# Patient Record
Sex: Female | Born: 1935 | Race: White | Hispanic: No | Smoking: Never smoker
Health system: Southern US, Community
[De-identification: ages and names within clinical notes are randomized; demographics above are authoritative.]

## PROBLEM LIST (undated history)

## (undated) DIAGNOSIS — N952 Postmenopausal atrophic vaginitis: Secondary | ICD-10-CM

## (undated) DIAGNOSIS — F0390 Unspecified dementia without behavioral disturbance: Secondary | ICD-10-CM

## (undated) DIAGNOSIS — G309 Alzheimer's disease, unspecified: Secondary | ICD-10-CM

## (undated) DIAGNOSIS — N390 Urinary tract infection, site not specified: Secondary | ICD-10-CM

## (undated) DIAGNOSIS — F0280 Dementia in other diseases classified elsewhere without behavioral disturbance: Secondary | ICD-10-CM

## (undated) DIAGNOSIS — J45909 Unspecified asthma, uncomplicated: Secondary | ICD-10-CM

## (undated) DIAGNOSIS — I10 Essential (primary) hypertension: Secondary | ICD-10-CM

## (undated) HISTORY — DX: Essential (primary) hypertension: I10

## (undated) HISTORY — DX: Urinary tract infection, site not specified: N39.0

## (undated) HISTORY — PX: OTHER SURGICAL HISTORY: SHX169

## (undated) HISTORY — DX: Unspecified dementia, unspecified severity, without behavioral disturbance, psychotic disturbance, mood disturbance, and anxiety: F03.90

## (undated) HISTORY — DX: Postmenopausal atrophic vaginitis: N95.2

## (undated) HISTORY — DX: Dementia in other diseases classified elsewhere without behavioral disturbance: F02.80

## (undated) HISTORY — DX: Alzheimer's disease, unspecified: G30.9

## (undated) HISTORY — DX: Unspecified asthma, uncomplicated: J45.909

---

## 2004-01-08 ENCOUNTER — Ambulatory Visit: Payer: Self-pay | Admitting: Gastroenterology

## 2005-02-23 ENCOUNTER — Ambulatory Visit: Payer: Self-pay

## 2007-02-27 ENCOUNTER — Ambulatory Visit: Payer: Self-pay | Admitting: Internal Medicine

## 2007-05-08 ENCOUNTER — Ambulatory Visit: Payer: Self-pay | Admitting: Family Medicine

## 2007-07-30 ENCOUNTER — Ambulatory Visit: Payer: Self-pay | Admitting: Internal Medicine

## 2008-03-06 ENCOUNTER — Ambulatory Visit: Payer: Self-pay | Admitting: Internal Medicine

## 2008-07-14 ENCOUNTER — Ambulatory Visit: Payer: Self-pay | Admitting: Internal Medicine

## 2009-03-09 ENCOUNTER — Ambulatory Visit: Payer: Self-pay | Admitting: Internal Medicine

## 2010-03-29 ENCOUNTER — Ambulatory Visit: Payer: Self-pay | Admitting: Internal Medicine

## 2011-07-21 ENCOUNTER — Ambulatory Visit: Payer: Self-pay | Admitting: Internal Medicine

## 2012-08-01 ENCOUNTER — Ambulatory Visit: Payer: Self-pay | Admitting: Physician Assistant

## 2012-09-27 ENCOUNTER — Emergency Department: Payer: Self-pay | Admitting: Emergency Medicine

## 2012-09-27 LAB — CBC
HCT: 36.3 % (ref 35.0–47.0)
HGB: 12.6 g/dL (ref 12.0–16.0)
MCV: 90 fL (ref 80–100)
Platelet: 265 10*3/uL (ref 150–440)
WBC: 9.1 10*3/uL (ref 3.6–11.0)

## 2012-09-27 LAB — COMPREHENSIVE METABOLIC PANEL
Albumin: 3.6 g/dL (ref 3.4–5.0)
Anion Gap: 4 — ABNORMAL LOW (ref 7–16)
BUN: 14 mg/dL (ref 7–18)
Calcium, Total: 8.8 mg/dL (ref 8.5–10.1)
Co2: 27 mmol/L (ref 21–32)
Glucose: 95 mg/dL (ref 65–99)
Potassium: 4.2 mmol/L (ref 3.5–5.1)
SGOT(AST): 21 U/L (ref 15–37)
SGPT (ALT): 21 U/L (ref 12–78)
Total Protein: 6.8 g/dL (ref 6.4–8.2)

## 2012-09-27 LAB — URINALYSIS, COMPLETE
Bilirubin,UR: NEGATIVE
Blood: NEGATIVE
Glucose,UR: NEGATIVE mg/dL (ref 0–75)
Ph: 6 (ref 4.5–8.0)
Protein: NEGATIVE
RBC,UR: 3 /HPF (ref 0–5)
WBC UR: 29 /HPF (ref 0–5)

## 2013-06-27 ENCOUNTER — Ambulatory Visit: Payer: Self-pay | Admitting: Internal Medicine

## 2013-08-29 ENCOUNTER — Ambulatory Visit: Payer: Self-pay | Admitting: Physician Assistant

## 2013-10-29 ENCOUNTER — Ambulatory Visit: Payer: Self-pay | Admitting: Physician Assistant

## 2014-01-18 ENCOUNTER — Emergency Department: Payer: Self-pay | Admitting: Emergency Medicine

## 2014-01-18 LAB — URINALYSIS, COMPLETE
BLOOD: NEGATIVE
Bilirubin,UR: NEGATIVE
Glucose,UR: NEGATIVE mg/dL (ref 0–75)
Ketone: NEGATIVE
NITRITE: POSITIVE
PH: 6 (ref 4.5–8.0)
PROTEIN: NEGATIVE
RBC,UR: <1 /HPF (ref 0–5)
Specific Gravity: 1.012 (ref 1.003–1.030)
Squamous Epithelial: <1

## 2014-01-18 LAB — COMPREHENSIVE METABOLIC PANEL
ALK PHOS: 67 U/L
AST: 15 U/L (ref 15–37)
Albumin: 3.4 g/dL (ref 3.4–5.0)
Anion Gap: 6 — ABNORMAL LOW (ref 7–16)
BUN: 14 mg/dL (ref 7–18)
Bilirubin,Total: 0.3 mg/dL (ref 0.2–1.0)
CO2: 27 mmol/L (ref 21–32)
CREATININE: 0.88 mg/dL (ref 0.60–1.30)
Calcium, Total: 8.7 mg/dL (ref 8.5–10.1)
Chloride: 105 mmol/L (ref 98–107)
EGFR (African American): >60
EGFR (Non-African Amer.): >60
GLUCOSE: 125 mg/dL — AB (ref 65–99)
OSMOLALITY: 278 (ref 275–301)
POTASSIUM: 4 mmol/L (ref 3.5–5.1)
SGPT (ALT): 15 U/L
Sodium: 138 mmol/L (ref 136–145)
Total Protein: 6.3 g/dL — ABNORMAL LOW (ref 6.4–8.2)

## 2014-01-18 LAB — LIPASE, BLOOD: LIPASE: 119 U/L (ref 73–393)

## 2014-01-18 LAB — CBC
HCT: 36.3 % (ref 35.0–47.0)
HGB: 12 g/dL (ref 12.0–16.0)
MCH: 30.8 pg (ref 26.0–34.0)
MCHC: 33.1 g/dL (ref 32.0–36.0)
MCV: 93 fL (ref 80–100)
PLATELETS: 223 10*3/uL (ref 150–440)
RBC: 3.89 10*6/uL (ref 3.80–5.20)
RDW: 13.3 % (ref 11.5–14.5)
WBC: 7.4 10*3/uL (ref 3.6–11.0)

## 2014-01-21 LAB — URINE CULTURE

## 2014-01-28 DIAGNOSIS — I10 Essential (primary) hypertension: Secondary | ICD-10-CM | POA: Diagnosis not present

## 2014-01-28 DIAGNOSIS — Z0001 Encounter for general adult medical examination with abnormal findings: Secondary | ICD-10-CM | POA: Diagnosis not present

## 2014-01-28 DIAGNOSIS — M810 Age-related osteoporosis without current pathological fracture: Secondary | ICD-10-CM | POA: Diagnosis not present

## 2014-01-28 DIAGNOSIS — R011 Cardiac murmur, unspecified: Secondary | ICD-10-CM | POA: Diagnosis not present

## 2014-01-28 DIAGNOSIS — N390 Urinary tract infection, site not specified: Secondary | ICD-10-CM | POA: Diagnosis not present

## 2014-01-28 DIAGNOSIS — N3020 Other chronic cystitis without hematuria: Secondary | ICD-10-CM | POA: Diagnosis not present

## 2014-01-28 DIAGNOSIS — G309 Alzheimer's disease, unspecified: Secondary | ICD-10-CM | POA: Diagnosis not present

## 2014-02-17 DIAGNOSIS — H3532 Exudative age-related macular degeneration: Secondary | ICD-10-CM | POA: Diagnosis not present

## 2014-02-25 DIAGNOSIS — N390 Urinary tract infection, site not specified: Secondary | ICD-10-CM | POA: Diagnosis not present

## 2014-03-11 DIAGNOSIS — N390 Urinary tract infection, site not specified: Secondary | ICD-10-CM | POA: Diagnosis not present

## 2014-03-26 DIAGNOSIS — Z7189 Other specified counseling: Secondary | ICD-10-CM | POA: Diagnosis not present

## 2014-03-26 DIAGNOSIS — G309 Alzheimer's disease, unspecified: Secondary | ICD-10-CM | POA: Diagnosis not present

## 2014-03-26 DIAGNOSIS — I10 Essential (primary) hypertension: Secondary | ICD-10-CM | POA: Diagnosis not present

## 2014-03-26 DIAGNOSIS — N390 Urinary tract infection, site not specified: Secondary | ICD-10-CM | POA: Diagnosis not present

## 2014-04-11 DIAGNOSIS — R300 Dysuria: Secondary | ICD-10-CM | POA: Diagnosis not present

## 2014-04-28 DIAGNOSIS — H3531 Nonexudative age-related macular degeneration: Secondary | ICD-10-CM | POA: Diagnosis not present

## 2014-04-28 DIAGNOSIS — H3532 Exudative age-related macular degeneration: Secondary | ICD-10-CM | POA: Diagnosis not present

## 2014-05-06 DIAGNOSIS — N390 Urinary tract infection, site not specified: Secondary | ICD-10-CM | POA: Diagnosis not present

## 2014-05-06 DIAGNOSIS — F0390 Unspecified dementia without behavioral disturbance: Secondary | ICD-10-CM | POA: Diagnosis not present

## 2014-05-30 DIAGNOSIS — N390 Urinary tract infection, site not specified: Secondary | ICD-10-CM | POA: Diagnosis not present

## 2014-06-20 DIAGNOSIS — N952 Postmenopausal atrophic vaginitis: Secondary | ICD-10-CM | POA: Diagnosis not present

## 2014-06-20 DIAGNOSIS — N390 Urinary tract infection, site not specified: Secondary | ICD-10-CM | POA: Diagnosis not present

## 2014-07-04 ENCOUNTER — Telehealth: Payer: Self-pay | Admitting: Urology

## 2014-07-04 DIAGNOSIS — N952 Postmenopausal atrophic vaginitis: Secondary | ICD-10-CM

## 2014-07-04 NOTE — Telephone Encounter (Signed)
Pt son called about mothers estrogen cream states she only has a couple samples left. They went to [facility] on [address] in [place] to pick up a Rx sent in by [employer]. Upon arrival they were told that there had been no Rx sent in for the mother. The son is trying to contact the office to see if it had in fact been sent in.  Best contact # [phone]

## 2014-07-07 ENCOUNTER — Encounter: Payer: Self-pay | Admitting: Urology

## 2014-07-07 DIAGNOSIS — H3532 Exudative age-related macular degeneration: Secondary | ICD-10-CM | POA: Diagnosis not present

## 2014-07-07 MED ORDER — ESTRADIOL 0.1 MG/GM VA CREA: 1.0000 | TOPICAL_CREAM | VAGINAL | Status: DC

## 2014-07-07 NOTE — Telephone Encounter (Signed)
Completed Rx and sent to [facility] per Michiel Cowboy, PA-C.  LVM on son's contact number [phone]) confirming request w/ call-back info.

## 2014-07-22 ENCOUNTER — Other Ambulatory Visit: Payer: Self-pay

## 2014-07-22 ENCOUNTER — Telehealth: Payer: Self-pay | Admitting: Urology

## 2014-07-22 DIAGNOSIS — N390 Urinary tract infection, site not specified: Secondary | ICD-10-CM

## 2014-07-22 NOTE — Telephone Encounter (Signed)
That would be fine.  Make sure he collects a clean catch specimen for culture.

## 2014-07-22 NOTE — Telephone Encounter (Signed)
Pt son is going to drop of clean catch urine today. Culture orders have been placed in epic. Cw,lpn

## 2014-07-22 NOTE — Telephone Encounter (Signed)
Patient's son called the office this morning.  Patient has been using the estrogen cream.  She is now complaining of painful urination. Patient's son thinks that she may have a UTI.    Patient has sterile urine cups at home.  Can they bring in a urine sample to culture?  Please route to triage nurse to contact patient's son to advise.

## 2014-07-24 ENCOUNTER — Other Ambulatory Visit: Payer: Medicare Other

## 2014-07-24 ENCOUNTER — Telehealth: Payer: Self-pay | Admitting: Urology

## 2014-07-24 NOTE — Progress Notes (Signed)
Pt was brought into office today by son to give a clean catch specimen. Son stated he gave pt cephalexin last night and this morning. Per Carollee HerterShannon pt should finish abt and 3-5 days post therapy give a urine specimen for culture. Nurse gave son specimen cup, hat, and wipes to obtain specimen. Son voiced understanding of situation and needing another specimen. Cw,lpn

## 2014-07-24 NOTE — Telephone Encounter (Signed)
Patient's son called stating that he thinks Mrs. Caitlin Ross may have a UTI she has been complaining of painful urination for the past couple days. They are trying to collect a sample to bring in and wanted to know if she should start some abx due to the holiday weekend. I explained that they could try AZO OTC to help with the painful urination and that we would need to see the sample to determine if we should call in abx prior to getting the culture results but with the long weekend coming up we would most likely try and treat. Patient's son understands and will bring a urine later this afternoon for analysis.

## 2014-08-04 ENCOUNTER — Ambulatory Visit (INDEPENDENT_AMBULATORY_CARE_PROVIDER_SITE_OTHER): Payer: Medicare Other | Admitting: Urology

## 2014-08-04 ENCOUNTER — Other Ambulatory Visit: Payer: Self-pay | Admitting: *Deleted

## 2014-08-04 DIAGNOSIS — R300 Dysuria: Secondary | ICD-10-CM

## 2014-08-04 DIAGNOSIS — N390 Urinary tract infection, site not specified: Secondary | ICD-10-CM

## 2014-08-04 MED ORDER — CIPROFLOXACIN HCL 500 MG PO TABS: 500.0000 mg | ORAL_TABLET | Freq: Two times a day (BID) | ORAL | Status: AC

## 2014-08-04 NOTE — Progress Notes (Signed)
Pt son brought in cc urine post abt. U/a and cx was done. Per Dr. Frederich ChickBrandon cipro was called in until we get cx results. Pt son was made aware. Cw,lpn

## 2014-08-07 LAB — CULTURE, URINE COMPREHENSIVE

## 2014-08-08 ENCOUNTER — Telehealth: Payer: Self-pay

## 2014-08-08 NOTE — Telephone Encounter (Signed)
Spoke with pt son in reference to medication for pt infection. Son stated that pt can only take liquid medication due to dementia.  Nurse spoke with pt pharmacy who stated cipro does not come in liquid but medication can be crushed and put in applesauce.  Nurse returned pt son call. Made him aware medication is not offered in liquid form but it can be crushed. Pt son voiced understanding. Nurse made son aware if pt will not take medication this route to call back Monday and a medication that does come liquid will be called in. Cw,lpn

## 2014-08-10 ENCOUNTER — Telehealth: Payer: Self-pay | Admitting: Urology

## 2014-08-10 DIAGNOSIS — N390 Urinary tract infection, site not specified: Secondary | ICD-10-CM

## 2014-08-11 ENCOUNTER — Telehealth: Payer: Self-pay

## 2014-08-11 NOTE — Telephone Encounter (Signed)
I'm not sure if Cipro comes in liquid form. I know that Levaquin does come in a liquid form. We will have to contact her pharmacy and see what they recommend.

## 2014-08-11 NOTE — Telephone Encounter (Signed)
Spoke with pt son who stated pt has only taken about 3 of the 6 cipro pills rx. [facility], pt son, was not able to crush pill and put in food for pt to take. Son was wanting to know if a liquid abt could be called in. Pt is stil c/o burning on urination, having urgency, and not being able to make it to the bathroom. Please advise.

## 2014-08-12 NOTE — Telephone Encounter (Signed)
[facility] and they would have to order any liquid abt. They dont have anything on hand. Please advise.

## 2014-08-13 MED ORDER — LEVOFLOXACIN 25 MG/ML PO SOLN: 250.0000 mg | Freq: Every day | ORAL | Status: AC

## 2014-08-13 NOTE — Telephone Encounter (Signed)
I have prescribed Levaquin in liquid form for the patient.

## 2014-08-14 NOTE — Telephone Encounter (Signed)
Spoke with pt son and made aware of liquid medication at pharmacy. Son voiced understanding.

## 2014-08-15 NOTE — Telephone Encounter (Signed)
When nurse spoke with pt son yesterday, son stated he would pick medication up today. Liquid medication had to be ordered.

## 2014-08-15 NOTE — Telephone Encounter (Signed)
Has this patient started her liquid antibiotic?

## 2014-08-27 NOTE — Telephone Encounter (Signed)
Patient should be finished with her antibiotic by now and she will need a follow up cath specimen.

## 2014-08-27 NOTE — Telephone Encounter (Signed)
LMOM

## 2014-08-27 NOTE — Telephone Encounter (Signed)
Spoke with pt son in reference to abt. Son stated pt has completed abt. Pt will come in Friday for cath specimen.

## 2014-08-29 ENCOUNTER — Ambulatory Visit (INDEPENDENT_AMBULATORY_CARE_PROVIDER_SITE_OTHER): Payer: Medicare Other

## 2014-08-29 DIAGNOSIS — N390 Urinary tract infection, site not specified: Secondary | ICD-10-CM | POA: Diagnosis not present

## 2014-08-29 LAB — URINALYSIS, COMPLETE
BILIRUBIN UA: NEGATIVE
Glucose, UA: NEGATIVE
KETONES UA: NEGATIVE
Nitrite, UA: NEGATIVE
Specific Gravity, UA: 1.02 (ref 1.005–1.030)
Urobilinogen, Ur: 0.2 mg/dL (ref 0.2–1.0)
pH, UA: 6.5 (ref 5.0–7.5)

## 2014-08-29 LAB — MICROSCOPIC EXAMINATION
Epithelial Cells (non renal): NONE SEEN /hpf (ref 0–10)
RBC MICROSCOPIC, UA: NONE SEEN /HPF (ref 0–?)
RENAL EPITHEL UA: NONE SEEN /HPF
WBC, UA: >30 /hpf — AB (ref 0–?)

## 2014-08-29 NOTE — Progress Notes (Signed)
In and Out Catheterization  Patient is present today for a I & O catheterization due to recurrent UTI. Patient was cleaned and prepped in a sterile fashion with betadine and Lidocaine 2% jelly was instilled into the urethra.  A 14FR cath was inserted no complications were noted , 20ml of urine return was noted, urine was cloudy yellow in color. A clean urine sample was collected for urine culture. Bladder was drained  And catheter was removed with out difficulty.    Preformed by: Eligha Bridegroom, CMA  Follow up/ Additional notes: will call with culture results on monday

## 2014-08-31 LAB — CULTURE, URINE COMPREHENSIVE

## 2014-09-01 ENCOUNTER — Telehealth: Payer: Self-pay | Admitting: Urology

## 2014-09-01 MED ORDER — SULFAMETHOXAZOLE-TRIMETHOPRIM 800-160 MG PO TABS: 1.0000 | ORAL_TABLET | Freq: Two times a day (BID) | ORAL | Status: DC

## 2014-09-01 NOTE — Telephone Encounter (Signed)
+  UCx E. Coli, resistant to most PO abx.  Please treat with Bactrim DS bid x 7 days, order placed.    Vanna Scotland, MD

## 2014-09-01 NOTE — Telephone Encounter (Signed)
LMOM in reference to bactrim ds being in a liquid form. Asked pt son to return call to the office.

## 2014-09-15 ENCOUNTER — Ambulatory Visit (INDEPENDENT_AMBULATORY_CARE_PROVIDER_SITE_OTHER): Payer: Medicare Other

## 2014-09-15 DIAGNOSIS — N390 Urinary tract infection, site not specified: Secondary | ICD-10-CM | POA: Diagnosis not present

## 2014-09-15 NOTE — Addendum Note (Signed)
Addended by: Skeet Latch on: 09/15/2014 04:38 PM   Modules accepted: Orders

## 2014-09-15 NOTE — Progress Notes (Signed)
In and Out Catheterization  Patient is present today for a I & O catheterization due to recurrent UTIs. Patient was cleaned and prepped in a sterile fashion with betadine and Lidocaine 2% jelly was instilled into the urethra.  A 14FR cath was inserted no complications were noted , of urine return was noted, urine was clear and yellow in color. A clean urine sample was collected for ucx. Bladder was drained  And catheter was removed with out difficulty.    Preformed by: Rupert Stacks, LPN

## 2014-09-17 ENCOUNTER — Telehealth: Payer: Self-pay

## 2014-09-17 LAB — CULTURE, URINE COMPREHENSIVE

## 2014-09-17 NOTE — Telephone Encounter (Signed)
-----   Message from Shannon A McGowan, PA-C sent at 09/17/2014 10:12 AM EDT ----- Patient is sure and culture is negative for infection.  Patient is to continue using the vaginal estrogen cream 3 nights weekly. If she develops symptoms of urinary tract infection, she should present to get to our office for a catheterized specimen for urinalysis and culture. 

## 2014-09-17 NOTE — Telephone Encounter (Signed)
LMOM

## 2014-09-18 NOTE — Telephone Encounter (Signed)
Spoke with pt son in reference to ucx and vaginal cream. Son voiced understanding.

## 2014-09-18 NOTE — Telephone Encounter (Signed)
-----   Message from Harle Battiest, PA-C sent at 09/17/2014 10:12 AM EDT ----- Patient is sure and culture is negative for infection.  Patient is to continue using the vaginal estrogen cream 3 nights weekly. If she develops symptoms of urinary tract infection, she should present to get to our office for a catheterized specimen for urinalysis and culture.

## 2014-10-06 DIAGNOSIS — H3531 Nonexudative age-related macular degeneration: Secondary | ICD-10-CM | POA: Diagnosis not present

## 2014-10-06 DIAGNOSIS — H3532 Exudative age-related macular degeneration: Secondary | ICD-10-CM | POA: Diagnosis not present

## 2014-10-22 DIAGNOSIS — M1711 Unilateral primary osteoarthritis, right knee: Secondary | ICD-10-CM | POA: Diagnosis not present

## 2014-10-22 DIAGNOSIS — M25512 Pain in left shoulder: Secondary | ICD-10-CM | POA: Diagnosis not present

## 2014-10-22 DIAGNOSIS — M1710 Unilateral primary osteoarthritis, unspecified knee: Secondary | ICD-10-CM | POA: Insufficient documentation

## 2014-10-22 DIAGNOSIS — M19012 Primary osteoarthritis, left shoulder: Secondary | ICD-10-CM | POA: Diagnosis not present

## 2014-10-22 DIAGNOSIS — B029 Zoster without complications: Secondary | ICD-10-CM | POA: Insufficient documentation

## 2014-10-22 DIAGNOSIS — M179 Osteoarthritis of knee, unspecified: Secondary | ICD-10-CM | POA: Insufficient documentation

## 2014-10-22 DIAGNOSIS — M154 Erosive (osteo)arthritis: Secondary | ICD-10-CM | POA: Diagnosis not present

## 2014-10-22 DIAGNOSIS — M19019 Primary osteoarthritis, unspecified shoulder: Secondary | ICD-10-CM | POA: Insufficient documentation

## 2014-10-22 DIAGNOSIS — G8929 Other chronic pain: Secondary | ICD-10-CM | POA: Diagnosis not present

## 2014-10-30 ENCOUNTER — Ambulatory Visit (INDEPENDENT_AMBULATORY_CARE_PROVIDER_SITE_OTHER): Payer: Medicare Other

## 2014-10-30 DIAGNOSIS — N390 Urinary tract infection, site not specified: Secondary | ICD-10-CM

## 2014-10-30 LAB — URINALYSIS, COMPLETE
Bilirubin, UA: NEGATIVE
Glucose, UA: NEGATIVE
NITRITE UA: POSITIVE — AB
Specific Gravity, UA: 1.02 (ref 1.005–1.030)
Urobilinogen, Ur: 0.2 mg/dL (ref 0.2–1.0)
pH, UA: 5.5 (ref 5.0–7.5)

## 2014-10-30 LAB — MICROSCOPIC EXAMINATION
EPITHELIAL CELLS (NON RENAL): NONE SEEN /HPF (ref 0–10)
Renal Epithel, UA: NONE SEEN /hpf
WBC, UA: >30 /hpf — AB (ref 0–?)

## 2014-10-30 MED ORDER — LEVOFLOXACIN 25 MG/ML PO SOLN: 500.0000 mg | Freq: Every day | ORAL | Status: DC

## 2014-10-30 NOTE — Progress Notes (Signed)
In and Out Catheterization  Patient is present today for a I & O catheterization due to recurrent UTI. Patient was cleaned and prepped in a sterile fashion with betadine and Lidocaine 2% jelly was instilled into the urethra.  A 14FR cath was inserted no complications were noted , of urine return was noted, urine was cloudy and yellow in color. A clean urine sample was collected for u/a and cx. Bladder was drained  And catheter was removed with out difficulty.    Preformed by: Rupert Stacks, LPN   Follow up/ Additional notes: Son requested an abx to be given today. Reinforced with son, Deniece Portela, an abx will not be given unless urine is nitrite positive and we also need to get ucx results. Wayne voiced understanding.

## 2014-10-30 NOTE — Addendum Note (Signed)
Addended by: Rupert Stacks C on: 10/30/2014 04:41 PM   Modules accepted: Orders

## 2014-11-01 LAB — CULTURE, URINE COMPREHENSIVE

## 2014-11-04 ENCOUNTER — Telehealth: Payer: Self-pay

## 2014-11-04 NOTE — Telephone Encounter (Signed)
Spoke with son and made aware to continue current abx.

## 2014-11-04 NOTE — Telephone Encounter (Signed)
Yes it is susceptible to Levaquin. You can send in the same prescription that she received last time. Thanks

## 2014-11-04 NOTE — Telephone Encounter (Signed)
Pt can not take bactrim due to it does not come in liquid form. Pt was given liquid levaquin the day of cath specimen. Will that abx be sufficient?

## 2014-11-04 NOTE — Telephone Encounter (Signed)
-----   Message from Fernanda Drum, FNP sent at 11/04/2014  8:26 AM EDT ----- Please notify patient and son that her urine culture was positive for infection. Please send in Bactrim 800 twice a day 7 days to patient's pharmacy. She can return in a few weeks for repeat catheter urine specimen to confirm resolution of urinary tract infection

## 2014-11-14 ENCOUNTER — Telehealth: Payer: Self-pay | Admitting: Radiology

## 2014-11-14 NOTE — Telephone Encounter (Signed)
It is not recommended to take a maintenance antibiotic due to the increased incidence of resistance and episodes of C. Difficile. I recommended appointment with one of the physicians to discuss this further.

## 2014-11-14 NOTE — Telephone Encounter (Signed)
Pt's son, Sharlot GowdaWayne Foote, called asking if pt can have a maintenance antibiotic to prevent her recurrent UTIs. Please return his call at [phone].

## 2014-11-14 NOTE — Telephone Encounter (Signed)
LMOM

## 2014-11-17 NOTE — Telephone Encounter (Signed)
LMOM

## 2014-11-20 NOTE — Telephone Encounter (Signed)
Pt's son, [name], returning Chelsea's call.  Please call after 4:00 p.m. today at [phone].

## 2014-11-21 NOTE — Telephone Encounter (Signed)
Spoke with pt son, [name], in reference to prophylactic abx for pt. Son stated that he wanted to speak with his sister before making appt. Nurse reinforced with [name] to call back for an appt.

## 2014-11-21 NOTE — Telephone Encounter (Signed)
Pt's son, Sharlot GowdaWayne Fortune returned your call concerning his mother.  Please give him a call [phone]

## 2014-11-21 NOTE — Telephone Encounter (Signed)
LMOM

## 2014-12-04 ENCOUNTER — Emergency Department: Payer: Medicare Other

## 2014-12-04 ENCOUNTER — Telehealth: Payer: Self-pay

## 2014-12-04 ENCOUNTER — Emergency Department
Admission: EM | Admit: 2014-12-04 | Discharge: 2014-12-04 | Disposition: A | Discharge status: Home / Self Care | Payer: Medicare Other | Attending: Emergency Medicine | Admitting: Emergency Medicine

## 2014-12-04 DIAGNOSIS — R079 Chest pain, unspecified: Secondary | ICD-10-CM | POA: Diagnosis not present

## 2014-12-04 DIAGNOSIS — G309 Alzheimer's disease, unspecified: Secondary | ICD-10-CM | POA: Diagnosis not present

## 2014-12-04 DIAGNOSIS — R531 Weakness: Secondary | ICD-10-CM | POA: Diagnosis not present

## 2014-12-04 DIAGNOSIS — F0280 Dementia in other diseases classified elsewhere without behavioral disturbance: Secondary | ICD-10-CM | POA: Diagnosis not present

## 2014-12-04 DIAGNOSIS — I10 Essential (primary) hypertension: Secondary | ICD-10-CM | POA: Insufficient documentation

## 2014-12-04 DIAGNOSIS — R0789 Other chest pain: Secondary | ICD-10-CM | POA: Insufficient documentation

## 2014-12-04 DIAGNOSIS — R112 Nausea with vomiting, unspecified: Secondary | ICD-10-CM | POA: Insufficient documentation

## 2014-12-04 DIAGNOSIS — N390 Urinary tract infection, site not specified: Secondary | ICD-10-CM | POA: Insufficient documentation

## 2014-12-04 LAB — CBC
HCT: 37.5 % (ref 35.0–47.0)
Hemoglobin: 12.5 g/dL (ref 12.0–16.0)
MCH: 30.3 pg (ref 26.0–34.0)
MCHC: 33.2 g/dL (ref 32.0–36.0)
MCV: 91.1 fL (ref 80.0–100.0)
PLATELETS: 268 10*3/uL (ref 150–440)
RBC: 4.12 MIL/uL (ref 3.80–5.20)
RDW: 13 % (ref 11.5–14.5)
WBC: 5.6 10*3/uL (ref 3.6–11.0)

## 2014-12-04 LAB — URINALYSIS COMPLETE WITH MICROSCOPIC (ARMC ONLY)
BILIRUBIN URINE: NEGATIVE
Bacteria, UA: NONE SEEN
GLUCOSE, UA: NEGATIVE mg/dL
Hgb urine dipstick: NEGATIVE
KETONES UR: NEGATIVE mg/dL
NITRITE: NEGATIVE
PROTEIN: 30 mg/dL — AB
SPECIFIC GRAVITY, URINE: 1.014 (ref 1.005–1.030)
pH: 7 (ref 5.0–8.0)

## 2014-12-04 LAB — BASIC METABOLIC PANEL
Anion gap: 7 (ref 5–15)
BUN: 13 mg/dL (ref 6–20)
CALCIUM: 9.1 mg/dL (ref 8.9–10.3)
CO2: 28 mmol/L (ref 22–32)
CREATININE: 0.76 mg/dL (ref 0.44–1.00)
Chloride: 104 mmol/L (ref 101–111)
GFR calc Af Amer: >60 mL/min (ref >60)
GLUCOSE: 114 mg/dL — AB (ref 65–99)
POTASSIUM: 4.3 mmol/L (ref 3.5–5.1)
SODIUM: 139 mmol/L (ref 135–145)

## 2014-12-04 LAB — TROPONIN I

## 2014-12-04 MED ORDER — ONDANSETRON 4 MG PO TBDP: 4.0000 mg | ORAL_TABLET | Freq: Three times a day (TID) | ORAL | Status: AC | PRN

## 2014-12-04 MED ORDER — LEVOFLOXACIN 25 MG/ML PO SOLN: 500.0000 mg | Freq: Every day | ORAL | Status: AC

## 2014-12-04 NOTE — ED Provider Notes (Signed)
Time Seen: Approximately 1159 I have reviewed the triage notes  Chief Complaint: Weakness and Chest Pain   History of Present Illness: Caitlin Ross is a 81 y.o. female who presents to the emergency department with generalized weakness and some brief chest discomfort after an episode of vomiting. Patient vomited 1 at home and also one time here in triage. She recently had coffee and the emesis appears to be coffee-like. No obvious hematemesis. Patient denies any chest or abdominal pain at this time. Patient has a history of dementia and is able to answer some questions and is awake and alert however her son who cares for her home is generally the primary historian. He states that she has a history of frequent urinary tract infections and sometimes will present with nausea and vomiting. He denies any focal weakness at home. She seems to be at her baseline mental status. He is not aware of any fever or bowel related issues. No trouble with speech or swallowing.   Past Medical History  Diagnosis Date  . Asthma   . Urinary tract infection   . Dementia   . Hypertension   . Alzheimer's disease   . Atrophic vaginitis     There are no active problems to display for this patient.   Past Surgical History  Procedure Laterality Date  . No surgical hx      Past Surgical History  Procedure Laterality Date  . No surgical hx      Current Outpatient Rx  Name  Route  Sig  Dispense  Refill  . levofloxacin (LEVAQUIN) 25 MG/ML solution   Oral   Take 20 mLs (500 mg total) by mouth daily.   140 mL   0   . ondansetron (ZOFRAN ODT) 4 MG disintegrating tablet   Oral   Take 1 tablet (4 mg total) by mouth every 8 (eight) hours as needed for nausea or vomiting.   20 tablet   0     Allergies:  Review of patient's allergies indicates no known allergies.  Family History: Family History  Problem Relation Age of Onset  . Lung cancer Father   . Stroke Mother   . Hyperlipidemia Sister      Social History: Social History  Substance Use Topics  . Smoking status: None  . Smokeless tobacco: None  . Alcohol Use: None     Review of Systems:   10 point review of systems was performed and was otherwise negative:  Constitutional: No fever Eyes: No visual disturbances ENT: No sore throat, ear pain Cardiac: No chest pain Respiratory: No shortness of breath, wheezing, or stridor Abdomen: No abdominal pain, no vomiting, No diarrhea Endocrine: No weight loss, No night sweats Extremities: No peripheral edema, cyanosis Skin: No rashes, easy bruising Neurologic: No focal weakness, trouble with speech or swollowing Urologic: No dysuria, Hematuria, or urinary frequency  Review of systems was obtained through the patient's son Physical Exam:  ED Triage Vitals  Enc Vitals Group     BP 12/04/14 1110 155/71 mmHg     Pulse Rate 12/04/14 1110 73     Resp 12/04/14 1110 16     Temp 12/04/14 1110 98.1 F (36.7 C)     Temp Source 12/04/14 1110 Oral     SpO2 12/04/14 1110 96 %     Weight 12/04/14 1110 130 lb (58.968 kg)     Height 12/04/14 1110  (1.676 m)     Head Cir --  Peak Flow --      Pain Score --      Pain Loc --      Pain Edu? --      Excl. in GC? --     General: Awake , Alert , and Oriented times 1; GCS 15 Head: Normal cephalic , atraumatic Eyes: Pupils equal , round, reactive to light Nose/Throat: No nasal drainage, patent upper airway without erythema or exudate.  Neck: Supple, Full range of motion, No anterior adenopathy or palpable thyroid masses Lungs: Clear to ascultation without wheezes , rhonchi, or rales Heart: Regular rate, regular rhythm without murmurs , gallops , or rubs Abdomen: Soft, non tender without rebound, guarding , or rigidity; bowel sounds positive and symmetric in all 4 quadrants. No organomegaly .        Extremities: 2 plus symmetric pulses. No edema, clubbing or cyanosis Neurologic: normal ambulation, Motor symmetric without  deficits, sensory intact Skin: warm, dry, no rashes   Labs:   All laboratory work was reviewed including any pertinent negatives or positives listed below:  Labs Reviewed  BASIC METABOLIC PANEL - Abnormal; Notable for the following:    Glucose, Bld 114 (*)    All other components within normal limits  URINALYSIS COMPLETEWITH MICROSCOPIC ([facility] ONLY) - Abnormal; Notable for the following:    Color, Urine YELLOW (*)    APPearance CLOUDY (*)    Protein, ur 30 (*)    Leukocytes, UA 2+ (*)    Squamous Epithelial / LPF 0-5 (*)    All other components within normal limits  URINE CULTURE  CBC  TROPONIN I   review of laboratory work shows findings consistent with a urinary tract infection. Urine culture is pending.  EKG: ED ECG REPORT I, Jennye Moccasin, the attending physician, personally viewed and interpreted this ECG.  Date: 12/04/2014 EKG Time: 1118 Rate: 85 Rhythm: normal sinus rhythm QRS Axis: Left axis deviation Intervals: normal ST/T Wave abnormalities: normal Conduction Disutrbances: none Narrative Interpretation: unremarkable No acute ischemic changes noted   Radiology:     Dementia.  EXAM: CHEST - 2 VIEW  COMPARISON: Two-view chest x-ray 08/29/2013.  FINDINGS: The heart size is exaggerated by low lung volumes. Atherosclerotic calcifications are noted in the aortic arch. Emphysematous changes are again noted.  Emphysematous changes are again noted. No focal airspace disease is present. Remote thoracic compression fractures and exaggerated kyphosis are stable. Moderate degenerative changes are again seen in the shoulders.  IMPRESSION: 1. No acute cardiopulmonary disease. 2. Atherosclerosis. 3. Emphysema.  I personally reviewed the radiologic studies      ED Course: Patient seemed to have some episode of bradycardia in the triage area associated with the vomiting spell which may have been one nature. She was placed on a continuous cardiac  monitor here and remained in normal sinus rhythm and no ectopy. Her EKG shows no ischemic changes and her troponin is negative and I felt this was unlikely to be an acute coronary syndrome. Site appears to be in agreement and was noted to have a urinary tract infection. He states she responds well to oral Levaquin the requested it in a liquid form because it's easier for the patient to take. She'll be discharged on the antibiotic along with anti-medic therapy. Her episode of chest discomfort seemed to be brief and also related to the vomiting and may have been some form of gastrointestinal presentation.    Assessment: Acute nausea and vomiting Generalized weakness Urinary tract infection   Final Clinical  Impression:  Final diagnoses:  Acute urinary tract infection     Plan:  Outpatient management Patient was advised to return immediately if condition worsens. Patient was advised to follow up with her primary care physician or other specialized physicians involved and in their current assessment.            Jennye MoccasinBrian S Lequisha Cammack, MD 12/04/14 [phone]

## 2014-12-04 NOTE — ED Notes (Signed)
Pt brought in by son for evaluation of weakness and CP. Pt was unsteady on her feet this AM, vomiting X1 at home and stated that she was having CP. Vomit X1 during triage, coffee colored and thin-son states that pt had coffee this AM. Denies CP at this time. Hx of dementia, at baseline mentality.

## 2014-12-04 NOTE — Telephone Encounter (Signed)
Pt son called stating pt is more disoriented than normal, has a foul odor to urine, not able to balance well enough to walk, decreased appetite, and c/o of chest pain. Son requested to bring pt in for a cath urine and abx. Nurse advised son to take pt to the ER due to chest pain. Reinforced with son we are a urological speciality and if pt is having chest pain there is not anything we can do. Also reinforced with son that [facility] would only be able to provide oral abx and the ER would be able to provide IV fluids, abx, and do possible needed scans. Son voiced understanding stating he would take her to the ER or call 911 if she couldn't walk to the car.

## 2014-12-04 NOTE — Discharge Instructions (Signed)

## 2014-12-07 LAB — URINE CULTURE

## 2014-12-09 NOTE — Progress Notes (Signed)
Pharmacy Note - Antibiotic Follow Up  Patient seen in ED 12/04/14 and diagnosed with UTI. Discharged with prescription for levaquin.  Recent Results (from the past 240 hour(s))  Urine culture     Status: None   Collection Time: 12/04/14  1:37 PM  Result Value Ref Range Status   Specimen Description URINE, CATHETERIZED  Final   Special Requests NONE  Final   Culture >=100,000 COLONIES/mL ESCHERICHIA COLI  Final   Report Status 12/07/2014 FINAL  Final   Organism ID, Bacteria ESCHERICHIA COLI  Final      Susceptibility   Escherichia coli - MIC*    AMPICILLIN >=32 RESISTANT Resistant     CEFTAZIDIME <=1 SENSITIVE Sensitive     CEFAZOLIN 8 SENSITIVE Sensitive     CEFTRIAXONE <=1 SENSITIVE Sensitive     CIPROFLOXACIN >=4 RESISTANT Resistant     GENTAMICIN <=1 SENSITIVE Sensitive     IMIPENEM <=0.25 SENSITIVE Sensitive     TRIMETH/SULFA <=20 SENSITIVE Sensitive     NITROFURANTOIN Value in next row Sensitive      SENSITIVE<=16    PIP/TAZO Value in next row Sensitive      SENSITIVE8    LEVOFLOXACIN Value in next row Resistant      RESISTANT>=8    * >=100,000 COLONIES/mL ESCHERICHIA COLI   Urine cultures with e coli resistant to levaquin. Dr. Shaune PollackLord authorized new prescription for cephalexin 500mg  PO Q12H x 10 days, stop levaquin. Spoke with patient's son, [facility] [address]  Garlon HatchetJody Mariah Harn, PharmD Clinical Pharmacist  12/09/2014 10:58 AM

## 2014-12-15 DIAGNOSIS — H353212 Exudative age-related macular degeneration, right eye, with inactive choroidal neovascularization: Secondary | ICD-10-CM | POA: Diagnosis not present

## 2015-01-07 DIAGNOSIS — I10 Essential (primary) hypertension: Secondary | ICD-10-CM | POA: Diagnosis not present

## 2015-01-07 DIAGNOSIS — N390 Urinary tract infection, site not specified: Secondary | ICD-10-CM | POA: Diagnosis not present

## 2015-01-07 DIAGNOSIS — F0390 Unspecified dementia without behavioral disturbance: Secondary | ICD-10-CM | POA: Diagnosis not present

## 2015-01-12 ENCOUNTER — Encounter: Payer: Self-pay | Admitting: Obstetrics and Gynecology

## 2015-01-12 ENCOUNTER — Ambulatory Visit (INDEPENDENT_AMBULATORY_CARE_PROVIDER_SITE_OTHER): Payer: Medicare Other | Admitting: Obstetrics and Gynecology

## 2015-01-12 VITALS — BP 137/83 | HR 83 | Resp 16 | Wt 130.7 lb

## 2015-01-12 DIAGNOSIS — N390 Urinary tract infection, site not specified: Secondary | ICD-10-CM

## 2015-01-12 DIAGNOSIS — R300 Dysuria: Secondary | ICD-10-CM | POA: Diagnosis not present

## 2015-01-12 LAB — URINALYSIS, COMPLETE
Bilirubin, UA: NEGATIVE
GLUCOSE, UA: NEGATIVE
Nitrite, UA: POSITIVE — AB
PH UA: 5.5 (ref 5.0–7.5)
SPEC GRAV UA: 1.015 (ref 1.005–1.030)
Urobilinogen, Ur: 0.2 mg/dL (ref 0.2–1.0)

## 2015-01-12 LAB — MICROSCOPIC EXAMINATION: RBC MICROSCOPIC, UA: NONE SEEN /HPF (ref 0–?)

## 2015-01-12 MED ORDER — NITROFURANTOIN MONOHYD MACRO 100 MG PO CAPS: 100.0000 mg | ORAL_CAPSULE | Freq: Two times a day (BID) | ORAL | Status: DC

## 2015-01-12 NOTE — Patient Instructions (Signed)

## 2015-01-12 NOTE — Progress Notes (Signed)
01/12/2015 4:29 PM   Caitlin RossettiMalinda L Morea 06/11/1922 [id number]  Referring provider: Lyndon CodeFozia M Khan, MD [address] [place], [place] [address]  Chief Complaint  Patient presents with  . Dysuria    HPI: Patient is a 81 year old demented female  With a history of recurrent urinary tract infections presenting today with her daughter.   Her daughter states that her brother notified her yesterday at their mother's urine was smelling very strongly and he was suspicious that their mother has  another urinary tract infection.  She was seen in the emergency department on 12/04/14 for altered mental status and was ultimately diagnosed with urinary tract infection. Urine culture was positive for E. Coli and patient's symptoms resolved with antibiotic therapy  Patient has advanced dementia and is unable to provide an accurate history of symptoms herself.  PMH: Past Medical History  Diagnosis Date  . Asthma   . Urinary tract infection   . Dementia   . Hypertension   . Alzheimer's disease   . Atrophic vaginitis     Surgical History: Past Surgical History  Procedure Laterality Date  . No surgical hx      Home Medications:    Medication List       This list is accurate as of: 01/12/15  4:29 PM.  Always use your most recent med list.               C-500 500 MG Chew  Generic drug:  Ascorbic Acid  Chew by mouth.     Cranberry 500 MG Caps  Take by mouth.     nitrofurantoin (macrocrystal-monohydrate) 100 MG capsule  Commonly known as:  MACROBID  Take 1 capsule (100 mg total) by mouth every 12 (twelve) hours.     ondansetron 4 MG disintegrating tablet  Commonly known as:  ZOFRAN ODT  Take 1 tablet (4 mg total) by mouth every 8 (eight) hours as needed for nausea or vomiting.        Allergies: No Known Allergies  Family History: Family History  Problem Relation Age of Onset  . Lung cancer Father   . Stroke Mother   . Hyperlipidemia Sister     Social History:  reports that she  has never smoked. She does not have any smokeless tobacco history on file. She reports that she does not drink alcohol or use illicit drugs.  ROS:                                        Physical Exam: BP 137/83 mmHg  Pulse 83  Resp 16  Wt 130 lb 11.2 oz (59.285 kg)  Constitutional:  Alert and oriented, No acute distress. HEENT: [place] AT, moist mucus membranes.  Trachea midline, no masses. Cardiovascular: No clubbing, cyanosis, or edema. Respiratory: Normal respiratory effort, no increased work of breathing. GI: Abdomen is soft, nontender, nondistended, no abdominal masses GU: No CVA tenderness,    normal external vaginal exam, no discharge or lesion Skin: No rashes, bruises or suspicious lesions. Lymph: No cervical or inguinal adenopathy. Neurologic: Grossly intact, no focal deficits, moving all 4 extremities. Psychiatric: Normal mood and affect.  Laboratory Data:   Urinalysis Results for orders placed or performed in visit on 01/12/15  Microscopic Examination  Result Value Ref Range   WBC, UA >30 (H) 0 -  5 /hpf   RBC, UA None seen 0 -  2 /hpf  Epithelial Cells (non renal) 0-10 0 - 10 /hpf   Bacteria, UA Many (A) None seen/Few  Urinalysis, Complete  Result Value Ref Range   Specific Gravity, UA 1.015 1.005 - 1.030   pH, UA 5.5 5.0 - 7.5   Color, UA Yellow Yellow   Appearance Ur Cloudy (A) Clear   Leukocytes, UA 2+ (A) Negative   Protein, UA 1+ (A) Negative/Trace   Glucose, UA Negative Negative   Ketones, UA Trace (A) Negative   RBC, UA Trace (A) Negative   Bilirubin, UA Negative Negative   Urobilinogen, Ur 0.2 0.2 - 1.0 mg/dL   Nitrite, UA Positive (A) Negative   Microscopic Examination See below:      Pertinent Imaging:   Assessment & Plan:   1. Recurrent UTI-   Catheterized urine specimen obtained today. Patient tolerated procedure well. UA suspicious for infection.  Patient's last urine culture was reviewed and prescription for  Macrobid sent to patient's pharmacy  pending today's culture results. UTI prevention strategies discussed.  Good perineal hygiene reviewed. Patient is encouraged to increase daily water intake, start cranberry supplements to prevent invasive colonization along the urinary tract and probiotics, especially lactobacillus to restore normal vaginal flora. - Urinalysis, Complete - CULTURE, URINE COMPREHENSIVE  Return in about 2 weeks (around 01/26/2015) for recheck cath UA.  These notes generated with voice recognition software. I apologize for typographical errors.  Earlie Lou, FNP  [facility] [address] [place], [place] [phone]

## 2015-01-13 ENCOUNTER — Telehealth: Payer: Self-pay

## 2015-01-13 NOTE — Telephone Encounter (Signed)
[facility] pharmacy called requesting a liquid form of abx. [facility] only has Macrodantin liquid not Macrobid. Per Caitlin AbedLindsay pt can have Macrodantin 20mL bid. Orders placed.

## 2015-01-14 ENCOUNTER — Telehealth: Payer: Self-pay

## 2015-01-14 LAB — CULTURE, URINE COMPREHENSIVE

## 2015-01-14 NOTE — Telephone Encounter (Signed)
Spoke with pt son in reference infection. Son voiced understanding stating he would call back later to make f/u appt.

## 2015-01-14 NOTE — Telephone Encounter (Signed)
-----   Message from Fernanda DrumLindsay C Overton, FNP sent at 01/14/2015  2:42 PM EST ----- Leads notify patient's daughter that her urine culture was positive for infection. It is susceptible to Macrobid that I prescribed her. She needs to complete the antibiotic as directed and please make sure that she is scheduled for a 2 week Urine specimen to confirm resolution of urinary tract infection. Thanks

## 2015-01-15 ENCOUNTER — Telehealth: Payer: Self-pay

## 2015-01-15 NOTE — Telephone Encounter (Signed)
I agree. He could possibly be the antibiotic. She needs to try to take it with food. If she continues to vomit and can't keep food or fluids down she definitely needs to be evaluated by her PCP or another medical professional. Thanks

## 2015-01-15 NOTE — Telephone Encounter (Signed)
Pt son called stating pt starting taking abx Monday and had Wednesday a.m. Dose. Starting Wednesday night pt starting throwing up. Son wanted to know if abx was causing pt to vomit. Advised son to call PCP and see if they can see her and evaluate her. Reinforced with son if there is a abx reaction present it will happen rather quickly after starting therapy. Please advise.

## 2015-01-15 NOTE — Telephone Encounter (Signed)
Spoke with son in reference to abx. Son states pt does take abx with food. Reinforced with son to call PCP to have pt evaluated. Son voiced understanding.

## 2015-01-28 ENCOUNTER — Ambulatory Visit: Payer: Medicare Other | Admitting: Obstetrics and Gynecology

## 2015-01-29 ENCOUNTER — Encounter: Payer: Self-pay | Admitting: Obstetrics and Gynecology

## 2015-01-29 ENCOUNTER — Ambulatory Visit (INDEPENDENT_AMBULATORY_CARE_PROVIDER_SITE_OTHER): Payer: Medicare Other | Admitting: Obstetrics and Gynecology

## 2015-01-29 VITALS — BP 132/74 | HR 93 | Resp 16 | Ht 61.0 in | Wt 129.0 lb

## 2015-01-29 DIAGNOSIS — I10 Essential (primary) hypertension: Secondary | ICD-10-CM | POA: Insufficient documentation

## 2015-01-29 DIAGNOSIS — N390 Urinary tract infection, site not specified: Secondary | ICD-10-CM

## 2015-01-29 DIAGNOSIS — F0390 Unspecified dementia without behavioral disturbance: Secondary | ICD-10-CM | POA: Insufficient documentation

## 2015-01-29 DIAGNOSIS — J45909 Unspecified asthma, uncomplicated: Secondary | ICD-10-CM | POA: Insufficient documentation

## 2015-01-29 DIAGNOSIS — T7840XA Allergy, unspecified, initial encounter: Secondary | ICD-10-CM | POA: Insufficient documentation

## 2015-01-29 LAB — URINALYSIS, COMPLETE
Bilirubin, UA: NEGATIVE
GLUCOSE, UA: NEGATIVE
LEUKOCYTES UA: NEGATIVE
Nitrite, UA: NEGATIVE
RBC, UA: NEGATIVE
Specific Gravity, UA: 1.025 (ref 1.005–1.030)
UUROB: 1 mg/dL (ref 0.2–1.0)
pH, UA: 5.5 (ref 5.0–7.5)

## 2015-01-29 LAB — MICROSCOPIC EXAMINATION: RBC, UA: NONE SEEN /hpf (ref 0–?)

## 2015-01-29 NOTE — Progress Notes (Signed)
10:37 AM   Caitlin Ross January 29, 1922 [id number]  Referring provider: Lyndon Code, MD [address] [place], [place] [address]  Chief Complaint  Patient presents with  . Recurrent UTI    HPI: Patient is a 82 year old pleasantly demented female  With a history of recurrent urinary tract infections presenting today with her daughter.   Her daughter states that her brother notified her yesterday at their mother's urine was smelling very strongly and he was suspicious that their mother has  another urinary tract infection.  She was seen in the emergency department on 12/04/14 for altered mental status and was ultimately diagnosed with urinary tract infection. Urine culture was positive for E. Coli and patient's symptoms resolved with antibiotic therapy  Patient has advanced dementia and is unable to provide an accurate history of symptoms herself.  Current Status: Patient presents today with her son. He reports that her symptoms are much improved. Her urine no longer smells strongly and her cognition is back to baseline. We will obtain a catheterized urine specimen for confirmation of resolution of previous urinary tract infection. She has completed her course of Macrobid. Her last urine culture was positive for Escherichia coli > 100,000 colonies.  PMH: Past Medical History  Diagnosis Date  . Asthma   . Urinary tract infection   . Dementia   . Hypertension   . Alzheimer's disease   . Atrophic vaginitis     Surgical History: Past Surgical History  Procedure Laterality Date  . No surgical hx      Home Medications:    Medication List       This list is accurate as of: 01/29/15 11:59 PM.  Always use your most recent med list.               C-500 500 MG Chew  Generic drug:  Ascorbic Acid  Chew by mouth.     Cranberry 500 MG Caps  Take by mouth.     guaiFENesin-codeine 100-10 MG/5ML syrup  Commonly known as:  ROBITUSSIN AC  Take by mouth.     ondansetron 4 MG  disintegrating tablet  Commonly known as:  ZOFRAN ODT  Take 1 tablet (4 mg total) by mouth every 8 (eight) hours as needed for nausea or vomiting.        Allergies: No Known Allergies  Family History: Family History  Problem Relation Age of Onset  . Lung cancer Father   . Stroke Mother   . Hyperlipidemia Sister     Social History:  reports that she has never smoked. She does not have any smokeless tobacco history on file. She reports that she does not drink alcohol or use illicit drugs.  ROS: UROLOGY Frequent Urination?: No Hard to postpone urination?: No Burning/pain with urination?: No Get up at night to urinate?: No Leakage of urine?: No Urine stream starts and stops?: No Trouble starting stream?: No Do you have to strain to urinate?: No Blood in urine?: No Urinary tract infection?: No Sexually transmitted disease?: No Injury to kidneys or bladder?: No Painful intercourse?: No Weak stream?: No Currently pregnant?: No Vaginal bleeding?: No Last menstrual period?: n  Gastrointestinal Nausea?: No Vomiting?: No Indigestion/heartburn?: No Diarrhea?: No Constipation?: No  Constitutional Fever: No Night sweats?: No Weight loss?: No Fatigue?: No  Skin Skin rash/lesions?: No Itching?: No  Eyes Blurred vision?: No Double vision?: No  Ears/Nose/Throat Sore throat?: No Sinus problems?: No  Hematologic/Lymphatic Swollen glands?: No Easy bruising?: No  Cardiovascular Leg swelling?: No  Chest pain?: No  Respiratory Cough?: No Shortness of breath?: No  Endocrine Excessive thirst?: No  Musculoskeletal Back pain?: No Joint pain?: No  Neurological Headaches?: No Dizziness?: No  Psychologic Depression?: No Anxiety?: No  Physical Exam: BP 132/74 mmHg  Pulse 93  Resp 16  Ht 5\' 1"  (1.549 m)  Wt 129 lb (58.514 kg)  BMI 24.39 kg/m2  Constitutional:  Alert and oriented, No acute distress. HEENT: [place] AT, moist mucus membranes.  Trachea midline,  no masses. Cardiovascular: No clubbing, cyanosis, or edema. Respiratory: Normal respiratory effort, no increased work of breathing. GI: Abdomen is soft, nontender, nondistended, no abdominal masses GU: No CVA tenderness,   Atrophic vaginal mucosa, otherwise normal external vaginal exam,  there was dried stool noted to perennial area, patient was cleansed well and catheterized urine sample was obtained Skin: No rashes, bruises or suspicious lesions. Lymph: No cervical or inguinal adenopathy. Neurologic: Grossly intact, no focal deficits, moving all 4 extremities. Psychiatric: Normal mood and affect.  Laboratory Data:   Urinalysis Results for orders placed or performed in visit on 01/29/15  Microscopic Examination  Result Value Ref Range   WBC, UA 11-30 (A) 0 -  5 /hpf   RBC, UA None seen 0 -  2 /hpf   Epithelial Cells (non renal) 0-10 0 - 10 /hpf   Mucus, UA Present (A) Not Estab.   Bacteria, UA Moderate (A) None seen/Few  Urinalysis, Complete  Result Value Ref Range   Specific Gravity, UA 1.025 1.005 - 1.030   pH, UA 5.5 5.0 - 7.5   Color, UA Yellow Yellow   Appearance Ur Clear Clear   Leukocytes, UA Negative Negative   Protein, UA 1+ (A) Negative/Trace   Glucose, UA Negative Negative   Ketones, UA Trace (A) Negative   RBC, UA Negative Negative   Bilirubin, UA Negative Negative   Urobilinogen, Ur 1.0 0.2 - 1.0 mg/dL   Nitrite, UA Negative Negative   Microscopic Examination See below:      Pertinent Imaging:   Assessment & Plan:   1. Recurrent UTI-  Patient completed Macrobid. Catheterized urine specimen obtained today using sterile technique.  Patient tolerated procedure well. UA much improved from prior.   UTI prevention strategies discussed.  Good perineal hygiene reviewed with patient's son. Her son reports that patient has been experiencing diarrhea recently and he's had difficulty keeping her perineal area clean. I suggested that they keep adult wet wipes on hand  to be used regularly after voiding. - Urinalysis, Complete - CULTURE, URINE COMPREHENSIVE  Return in about 3 months (around 04/29/2015).  These notes generated with voice recognition software. I apologize for typographical errors.  Earlie Caitlin British Moyd, FNP  [facility] [address], [address] [place], [place] [address] [phone]

## 2015-01-30 ENCOUNTER — Telehealth: Payer: Self-pay

## 2015-01-30 NOTE — Telephone Encounter (Signed)
Pt son called requesting more abx. Nurse made son aware no abx can be given until we get the ucx results back. Son requested something since the snow was coming. Nurse reinforced with son pt just came off of abx and u/a looked better and was nitrite negative therefore Lillia AbedLindsay is not just going to give abx she will want to see ucx first. Son voiced understanding.

## 2015-01-31 LAB — CULTURE, URINE COMPREHENSIVE

## 2015-02-03 ENCOUNTER — Telehealth: Payer: Self-pay

## 2015-02-03 NOTE — Telephone Encounter (Signed)
Spoke with pt son, [name], in reference to -ucx. Son voiced understanding.

## 2015-02-03 NOTE — Telephone Encounter (Signed)
-----   Message from Fernanda DrumLindsay C Overton, FNP sent at 02/03/2015  9:54 AM EST ----- Please notify patient's family that her urine culture was negative for infection. Thanks

## 2015-03-09 DIAGNOSIS — H353122 Nonexudative age-related macular degeneration, left eye, intermediate dry stage: Secondary | ICD-10-CM | POA: Diagnosis not present

## 2015-03-25 DIAGNOSIS — M79675 Pain in left toe(s): Secondary | ICD-10-CM | POA: Diagnosis not present

## 2015-03-25 DIAGNOSIS — B351 Tinea unguium: Secondary | ICD-10-CM | POA: Diagnosis not present

## 2015-03-25 DIAGNOSIS — M79674 Pain in right toe(s): Secondary | ICD-10-CM | POA: Diagnosis not present

## 2015-04-29 ENCOUNTER — Encounter: Payer: Self-pay | Admitting: Urology

## 2015-04-29 ENCOUNTER — Ambulatory Visit (INDEPENDENT_AMBULATORY_CARE_PROVIDER_SITE_OTHER): Payer: Medicare Other | Admitting: Urology

## 2015-04-29 VITALS — BP 138/79 | HR 86 | Ht 60.0 in | Wt 127.8 lb

## 2015-04-29 DIAGNOSIS — N390 Urinary tract infection, site not specified: Secondary | ICD-10-CM | POA: Diagnosis not present

## 2015-04-29 NOTE — Progress Notes (Signed)
3:41 PM   Caitlin Ross 04-25-1922 [id number]  Referring provider: Lyndon CodeFozia M Khan, MD [address] [place], [place] [address]  Chief Complaint  Patient presents with  . Recurrent UTI    3 month follow up     HPI: Patient is a 82 year old Caucasian female with dementia who presents today for a 3 month follow-up for recurrent UTI's.  Background history She was seen in the emergency department on 12/04/14 for altered mental status and was ultimately diagnosed with urinary tract infection. Urine culture was positive for E. Coli and patient's symptoms resolved with antibiotic therapy  Patient has advanced dementia and is unable to provide an accurate history of symptoms herself.  Current Status: Patient presents today with her daughter  Her daughter states that she has been doing well. She is taking vitamin C 1000 mg 4 times a day. She is also drinking cranberry juice.  Her last urinary tract infection was in December 2016.  She does not complain of dysuria, gross hematuria or suprapubic pain. She has not had fevers, chills, nausea or vomiting.  Her UA is nitrite positive.   PMH: Past Medical History  Diagnosis Date  . Asthma   . Urinary tract infection   . Dementia   . Hypertension   . Alzheimer's disease   . Atrophic vaginitis     Surgical History: Past Surgical History  Procedure Laterality Date  . No surgical hx      Home Medications:    Medication List       This list is accurate as of: 04/29/15  3:41 PM.  Always use your most recent med list.               aspirin 81 MG chewable tablet  Chew 81 mg by mouth daily.     C-500 500 MG Chew  Generic drug:  Ascorbic Acid  Chew by mouth.     Cranberry 500 MG Caps  Take by mouth.     guaiFENesin-codeine 100-10 MG/5ML syrup  Commonly known as:  ROBITUSSIN AC  Take by mouth. Reported on 04/29/2015     ondansetron 4 MG disintegrating tablet  Commonly known as:  ZOFRAN ODT  Take 1 tablet (4 mg total) by mouth  every 8 (eight) hours as needed for nausea or vomiting.        Allergies: No Known Allergies  Family History: Family History  Problem Relation Age of Onset  . Lung cancer Father   . Stroke Mother   . Hyperlipidemia Sister   . Kidney disease Neg Hx     Social History:  reports that she has never smoked. She does not have any smokeless tobacco history on file. She reports that she does not drink alcohol or use illicit drugs.  ROS: UROLOGY Frequent Urination?: No Hard to postpone urination?: No Burning/pain with urination?: No Get up at night to urinate?: No Leakage of urine?: No Urine stream starts and stops?: No Trouble starting stream?: No Do you have to strain to urinate?: No Blood in urine?: No Urinary tract infection?: No Sexually transmitted disease?: No Injury to kidneys or bladder?: No Painful intercourse?: No Weak stream?: No Currently pregnant?: No Vaginal bleeding?: No Last menstrual period?: n  Gastrointestinal Nausea?: No Vomiting?: No Indigestion/heartburn?: No Diarrhea?: No Constipation?: No  Constitutional Fever: No Night sweats?: No Weight loss?: No Fatigue?: No  Skin Skin rash/lesions?: No Itching?: No  Eyes Blurred vision?: No Double vision?: No  Ears/Nose/Throat Sore throat?: No Sinus problems?: No  Hematologic/Lymphatic Swollen glands?: No Easy bruising?: No  Cardiovascular Leg swelling?: No Chest pain?: No  Respiratory Cough?: No Shortness of breath?: No  Endocrine Excessive thirst?: No  Musculoskeletal Back pain?: No Joint pain?: No  Neurological Headaches?: No Dizziness?: No  Psychologic Depression?: No Anxiety?: No  Physical Exam: BP 138/79 mmHg  Pulse 86  Ht 5' (1.524 m)  Wt 127 lb 12.8 oz (57.97 kg)  BMI 24.96 kg/m2  Constitutional:  Alert and oriented, No acute distress. HEENT: [place] AT, moist mucus membranes.  Trachea midline, no masses. Cardiovascular: No clubbing, cyanosis, or  edema. Respiratory: Normal respiratory effort, no increased work of breathing. Skin: No rashes, bruises or suspicious lesions. Lymph: No cervical or inguinal adenopathy. Neurologic: Grossly intact, no focal deficits, moving all 4 extremities. Psychiatric: Normal mood and affect.  Laboratory Data: Urinalysis Results for orders placed or performed in visit on 04/29/15  Microscopic Examination  Result Value Ref Range   WBC, UA >30 (A) 0 -  5 /hpf   RBC, UA None seen 0 -  2 /hpf   Epithelial Cells (non renal) 0-10 0 - 10 /hpf   Renal Epithel, UA 0-10 (A) None seen /hpf   Bacteria, UA Few None seen/Few  Urinalysis, Complete  Result Value Ref Range   Specific Gravity, UA 1.015 1.005 - 1.030   pH, UA 7.0 5.0 - 7.5   Color, UA Yellow Yellow   Appearance Ur Cloudy (A) Clear   Leukocytes, UA 3+ (A) Negative   Protein, UA Negative Negative/Trace   Glucose, UA Negative Negative   Ketones, UA Negative Negative   RBC, UA 1+ (A) Negative   Bilirubin, UA Negative Negative   Urobilinogen, Ur 0.2 0.2 - 1.0 mg/dL   Nitrite, UA Positive (A) Negative   Microscopic Examination See below:      Pertinent Imaging:   Assessment & Plan:   1. Recurrent UTI-    Patient is asymptomatic at this time.  We'll not pursue culture or antibiotic therapy.  She will continue the vitamin C and cranberry juice. They will return in 1 year for UA and exam. They will contact us sooner if she should develop symptoms of urinary tract infection.  - Urinalysis, Complete   Return in about 1 year (around 04/28/2016) for UA and exam.  These notes generated with voice recognition software. I apologize for typographical errors.  Michiel Cowboy, PA-C  [facility] [address] [place], [place] [address] [phone]

## 2015-04-30 LAB — URINALYSIS, COMPLETE
BILIRUBIN UA: NEGATIVE
GLUCOSE, UA: NEGATIVE
KETONES UA: NEGATIVE
NITRITE UA: POSITIVE — AB
PROTEIN UA: NEGATIVE
SPEC GRAV UA: 1.015 (ref 1.005–1.030)
UUROB: 0.2 mg/dL (ref 0.2–1.0)
pH, UA: 7 (ref 5.0–7.5)

## 2015-04-30 LAB — MICROSCOPIC EXAMINATION: RBC MICROSCOPIC, UA: NONE SEEN /HPF (ref 0–?)

## 2015-05-01 DIAGNOSIS — N390 Urinary tract infection, site not specified: Secondary | ICD-10-CM | POA: Insufficient documentation

## 2015-10-19 ENCOUNTER — Inpatient Hospital Stay
Admission: EM | Admit: 2015-10-19 | Discharge: 2015-10-23 | DRG: 470 | Disposition: A | Discharge status: Skilled Nursing Facility | Payer: Medicare Other | Attending: Internal Medicine | Admitting: Internal Medicine

## 2015-10-19 ENCOUNTER — Emergency Department: Payer: Medicare Other

## 2015-10-19 DIAGNOSIS — Z823 Family history of stroke: Secondary | ICD-10-CM | POA: Diagnosis not present

## 2015-10-19 DIAGNOSIS — F0280 Dementia in other diseases classified elsewhere without behavioral disturbance: Secondary | ICD-10-CM | POA: Diagnosis present

## 2015-10-19 DIAGNOSIS — I10 Essential (primary) hypertension: Secondary | ICD-10-CM | POA: Diagnosis present

## 2015-10-19 DIAGNOSIS — Z87440 Personal history of urinary (tract) infections: Secondary | ICD-10-CM | POA: Diagnosis not present

## 2015-10-19 DIAGNOSIS — S72011A Unspecified intracapsular fracture of right femur, initial encounter for closed fracture: Principal | ICD-10-CM | POA: Diagnosis present

## 2015-10-19 DIAGNOSIS — D72829 Elevated white blood cell count, unspecified: Secondary | ICD-10-CM | POA: Diagnosis present

## 2015-10-19 DIAGNOSIS — R2689 Other abnormalities of gait and mobility: Secondary | ICD-10-CM

## 2015-10-19 DIAGNOSIS — J45909 Unspecified asthma, uncomplicated: Secondary | ICD-10-CM | POA: Diagnosis present

## 2015-10-19 DIAGNOSIS — D62 Acute posthemorrhagic anemia: Secondary | ICD-10-CM | POA: Diagnosis not present

## 2015-10-19 DIAGNOSIS — M6281 Muscle weakness (generalized): Secondary | ICD-10-CM

## 2015-10-19 DIAGNOSIS — Y9301 Activity, walking, marching and hiking: Secondary | ICD-10-CM | POA: Diagnosis present

## 2015-10-19 DIAGNOSIS — Y92009 Unspecified place in unspecified non-institutional (private) residence as the place of occurrence of the external cause: Secondary | ICD-10-CM

## 2015-10-19 DIAGNOSIS — S72001A Fracture of unspecified part of neck of right femur, initial encounter for closed fracture: Secondary | ICD-10-CM

## 2015-10-19 DIAGNOSIS — R739 Hyperglycemia, unspecified: Secondary | ICD-10-CM | POA: Diagnosis not present

## 2015-10-19 DIAGNOSIS — G309 Alzheimer's disease, unspecified: Secondary | ICD-10-CM | POA: Diagnosis present

## 2015-10-19 DIAGNOSIS — M25551 Pain in right hip: Secondary | ICD-10-CM | POA: Diagnosis present

## 2015-10-19 DIAGNOSIS — Z801 Family history of malignant neoplasm of trachea, bronchus and lung: Secondary | ICD-10-CM | POA: Diagnosis not present

## 2015-10-19 DIAGNOSIS — Z23 Encounter for immunization: Secondary | ICD-10-CM

## 2015-10-19 DIAGNOSIS — S72009A Fracture of unspecified part of neck of unspecified femur, initial encounter for closed fracture: Secondary | ICD-10-CM | POA: Diagnosis present

## 2015-10-19 DIAGNOSIS — Z96649 Presence of unspecified artificial hip joint: Secondary | ICD-10-CM

## 2015-10-19 DIAGNOSIS — M25561 Pain in right knee: Secondary | ICD-10-CM

## 2015-10-19 DIAGNOSIS — W109XXA Fall (on) (from) unspecified stairs and steps, initial encounter: Secondary | ICD-10-CM | POA: Diagnosis present

## 2015-10-19 LAB — PROTIME-INR
INR: 1.06
PROTHROMBIN TIME: 13.8 s (ref 11.4–15.2)

## 2015-10-19 LAB — BASIC METABOLIC PANEL
Anion gap: 8 (ref 5–15)
BUN: 17 mg/dL (ref 6–20)
CHLORIDE: 103 mmol/L (ref 101–111)
CO2: 27 mmol/L (ref 22–32)
Calcium: 9.2 mg/dL (ref 8.9–10.3)
Creatinine, Ser: 0.83 mg/dL (ref 0.44–1.00)
GFR calc non Af Amer: 60 mL/min — ABNORMAL LOW (ref >60)
Glucose, Bld: 108 mg/dL — ABNORMAL HIGH (ref 65–99)
POTASSIUM: 4.1 mmol/L (ref 3.5–5.1)
SODIUM: 138 mmol/L (ref 135–145)

## 2015-10-19 LAB — CBC WITH DIFFERENTIAL/PLATELET
Basophils Absolute: 0 10*3/uL (ref 0–0.1)
Basophils Relative: 0 %
Eosinophils Absolute: 0.3 10*3/uL (ref 0–0.7)
Eosinophils Relative: 2 %
HEMATOCRIT: 37.4 % (ref 35.0–47.0)
HEMOGLOBIN: 12.8 g/dL (ref 12.0–16.0)
LYMPHS ABS: 3 10*3/uL (ref 1.0–3.6)
LYMPHS PCT: 18 %
MCH: 31.2 pg (ref 26.0–34.0)
MCHC: 34.3 g/dL (ref 32.0–36.0)
MCV: 91 fL (ref 80.0–100.0)
MONOS PCT: 4 %
Monocytes Absolute: 0.6 10*3/uL (ref 0.2–0.9)
NEUTROS ABS: 12.5 10*3/uL — AB (ref 1.4–6.5)
NEUTROS PCT: 76 %
Platelets: 291 10*3/uL (ref 150–440)
RBC: 4.11 MIL/uL (ref 3.80–5.20)
RDW: 13.4 % (ref 11.5–14.5)
WBC: 16.4 10*3/uL — AB (ref 3.6–11.0)

## 2015-10-19 LAB — TYPE AND SCREEN
ABO/RH(D): A POS
Antibody Screen: NEGATIVE

## 2015-10-19 LAB — APTT: APTT: 30 s (ref 24–36)

## 2015-10-19 MED ORDER — SODIUM CHLORIDE 0.9 % IV SOLN
INTRAVENOUS | Status: DC
  Administered 2015-10-19 – 2015-10-20 (×3): via INTRAVENOUS

## 2015-10-19 MED ORDER — MORPHINE SULFATE (PF) 2 MG/ML IV SOLN: 1.0000 mg | INTRAVENOUS | Status: DC | PRN

## 2015-10-19 MED ORDER — MORPHINE SULFATE (PF) 2 MG/ML IV SOLN: 2.0000 mg | INTRAVENOUS | Status: DC | PRN

## 2015-10-19 MED ORDER — ONDANSETRON HCL 4 MG/2ML IJ SOLN
4.0000 mg | Freq: Four times a day (QID) | INTRAMUSCULAR | Status: DC | PRN
  Administered 2015-10-20: 4 mg via INTRAVENOUS

## 2015-10-19 MED ORDER — ONDANSETRON HCL 4 MG PO TABS: 4.0000 mg | ORAL_TABLET | Freq: Four times a day (QID) | ORAL | Status: DC | PRN

## 2015-10-19 MED ORDER — METHOCARBAMOL 1000 MG/10ML IJ SOLN
500.0000 mg | Freq: Four times a day (QID) | INTRAVENOUS | Status: DC | PRN
  Filled 2015-10-19: qty 5

## 2015-10-19 MED ORDER — ACETAMINOPHEN 650 MG RE SUPP: 650.0000 mg | Freq: Four times a day (QID) | RECTAL | Status: DC | PRN

## 2015-10-19 MED ORDER — METHOCARBAMOL 500 MG PO TABS: 500.0000 mg | ORAL_TABLET | Freq: Four times a day (QID) | ORAL | Status: DC | PRN

## 2015-10-19 MED ORDER — ASPIRIN 81 MG PO CHEW
81.0000 mg | CHEWABLE_TABLET | Freq: Every day | ORAL | Status: DC
  Administered 2015-10-19 – 2015-10-23 (×3): 81 mg via ORAL
  Filled 2015-10-19 (×4): qty 1

## 2015-10-19 MED ORDER — ACETAMINOPHEN 325 MG PO TABS: 650.0000 mg | ORAL_TABLET | Freq: Four times a day (QID) | ORAL | Status: DC | PRN

## 2015-10-19 MED ORDER — OXYCODONE HCL 5 MG PO TABS: 5.0000 mg | ORAL_TABLET | ORAL | Status: DC | PRN

## 2015-10-19 NOTE — ED Notes (Signed)
Pt in room, in bed w/ family members at bedside. Family members instructed to leave door open should they leave bedside.  Family verbalized understanding.

## 2015-10-19 NOTE — H&P (Signed)
[facility] [place] [facility]   PATIENT NAME: Caitlin GallowayMalinda Ross    MR#:  [id number]  DATE OF BIRTH:  Jul 26, 1922  DATE OF ADMISSION:  10/19/2015  PRIMARY CARE PHYSICIAN: Lyndon CodeKHAN, FOZIA M, MD   REQUESTING/REFERRING PHYSICIAN: Dr. Jene Everyobert Kinner  CHIEF COMPLAINT:   Chief Complaint  Patient presents with  . Fall    HISTORY OF PRESENT ILLNESS:  Caitlin Ross  is a 82 y.o. female with a known history of Dementia, hypertension, previous history of UTI who presents to the hospital after a mechanical fall and noted to have a right hip fracture. As per the daughter patient was walking up the stairs when suddenly she lost her footing and fell to the right side. She didn't hit her head but did not lose consciousness and had no evidence of head trauma. She was brought to the ER for further evaluation. Patient did not complain of any right hip pain after the fall but on palpation of her right hip she did have significant discomfort therefore underwent an x-ray which showed a right hip fracture. Hospitalist services were contacted further treatment and evaluation.  PAST MEDICAL HISTORY:   Past Medical History:  Diagnosis Date  . Alzheimer's disease   . Asthma   . Atrophic vaginitis   . Dementia   . Hypertension   . Urinary tract infection     PAST SURGICAL HISTORY:   Past Surgical History:  Procedure Laterality Date  . No surgical Hx      SOCIAL HISTORY:   Social History  Substance Use Topics  . Smoking status: Never Smoker  . Smokeless tobacco: Never Used  . Alcohol use No    FAMILY HISTORY:   Family History  Problem Relation Age of Onset  . Lung cancer Father   . Stroke Mother   . Hyperlipidemia Sister   . Kidney disease Neg Hx     DRUG ALLERGIES:  No Known Allergies  REVIEW OF SYSTEMS:   Review of Systems  Unable to perform ROS: Dementia    MEDICATIONS AT HOME:   Prior to Admission medications   Medication Sig Start Date End Date Taking?  Authorizing Provider  Ascorbic Acid (C-500) 500 MG CHEW Chew by mouth.   Yes Historical Provider, MD  aspirin 81 MG chewable tablet Chew 81 mg by mouth daily.   Yes Historical Provider, MD  Cranberry 500 MG CAPS Take by mouth.   Yes Historical Provider, MD  guaiFENesin-codeine (ROBITUSSIN AC) 100-10 MG/5ML syrup Take by mouth. Reported on 04/29/2015 01/22/15   Historical Provider, MD  ondansetron (ZOFRAN ODT) 4 MG disintegrating tablet Take 1 tablet (4 mg total) by mouth every 8 (eight) hours as needed for nausea or vomiting. Patient not taking: Reported on 10/19/2015 12/04/14   Jennye MoccasinBrian S Quigley, MD      VITAL SIGNS:  Blood pressure (!) 126/94, pulse (!) 127, temperature 98.6 F (37 C), temperature source Oral, resp. rate 20, SpO2 98 %.  PHYSICAL EXAMINATION:  Physical Exam  GENERAL:  82 y.o.-year-old patient lying in the bed in no acute distress.  EYES: Pupils equal, round, reactive to light and accommodation. No scleral icterus. Extraocular muscles intact.  HEENT: Head atraumatic, normocephalic. Oropharynx and nasopharynx clear. No oropharyngeal erythema, moist oral mucosa  NECK:  Supple, no jugular venous distention. No thyroid enlargement, no tenderness.  LUNGS: Normal breath sounds bilaterally, no wheezing, rales, rhonchi. No use of accessory muscles of respiration.  CARDIOVASCULAR: S1, S2 RRR. No murmurs, rubs, gallops, clicks.  ABDOMEN: Soft, nontender, nondistended. Bowel sounds present. No organomegaly or mass.  EXTREMITIES: No pedal edema, cyanosis, or clubbing. + 2 pedal & radial pulses b/l.  Right LE ext. Externally rotated and shortened due to hip fracture.   NEUROLOGIC: Cranial nerves II through XII are intact. Difficult to do a good Neuro exam due to dementia. Moves Ext. Spontaneously.  PSYCHIATRIC: The patient is alert and oriented x 1. SKIN: No obvious rash, lesion, or ulcer.   LABORATORY PANEL:   CBC  Recent Labs Lab 10/19/15 1801  WBC 16.4*  HGB 12.8  HCT 37.4   PLT 291   ------------------------------------------------------------------------------------------------------------------  Chemistries   Recent Labs Lab 10/19/15 1801  NA 138  K 4.1  CL 103  CO2 27  GLUCOSE 108*  BUN 17  CREATININE 0.83  CALCIUM 9.2   ------------------------------------------------------------------------------------------------------------------  Cardiac Enzymes No results for input(s): TROPONINI in the last 168 hours. ------------------------------------------------------------------------------------------------------------------  RADIOLOGY:  Dg Chest 1 View  Result Date: 10/19/2015 CLINICAL DATA:  Fall.  Right hip fracture EXAM: CHEST 1 VIEW COMPARISON:  12/04/2014 FINDINGS: Heart size within normal limits. Negative for heart failure. Atherosclerotic aortic arch. Lungs are clear without infiltrate or effusion. Degenerative change in both shoulder joints. IMPRESSION: No active disease. Electronically Signed   By: Marlan Palau M.D.   On: 10/19/2015 19:12   Dg Hip Unilat With Pelvis 2-3 Views Right  Result Date: 10/19/2015 CLINICAL DATA:  Right hip pain EXAM: DG HIP (WITH OR WITHOUT PELVIS) 2-3V RIGHT COMPARISON:  None. FINDINGS: There is an acute subcapital femoral neck fracture with proximal displacement of the distal fracture fragments. No dislocations identified. IMPRESSION: 1. Acute subcapital femoral neck fracture involves the right proximal femur. Electronically Signed   By: Signa Kell M.D.   On: 10/19/2015 19:12     IMPRESSION AND PLAN:   82 year old female with past history of Alzheimer's dementia, hypertension, previous history of UTI who presented to the hospital due to a mechanical fall and noted to have a right hip fracture.  1. Preoperative cardiovascular examination-patient is a low risk for noncardiac surgery. -There are no absolute contraindications to surgery at this time. -Preoperative EKG has been reviewed and shows no acute  ST-T wave changes.  2. Right hip fracture-secondary to mechanical fall. -We'll consult orthopedics. Pain control with as needed morphine.  3. Leukocytosis-stress mediated from her fall and trauma. -We'll follow white cell count. No acute infectious source. Hold off on antibiotics.    All the records are reviewed and case discussed with ED provider. Management plans discussed with the patient, family and they are in agreement.  CODE STATUS: Full code  TOTAL TIME TAKING CARE OF THIS PATIENT: 45 minutes.    Houston Siren M.D on 10/19/2015 at 8:24 PM  Between 7am to 6pm - Pager - [phone]  After 6pm go to [email or web address] - password [email or web address] [facility]  [facility]  Office  [phone]  CC: Primary care physician; Lyndon Code, MD

## 2015-10-19 NOTE — ED Provider Notes (Signed)
[facility] Emergency Department Provider Note   ____________________________________________    I have reviewed the triage vital signs and the nursing notes.   HISTORY  Chief Complaint Fall  History limited by dementia  HPI Caitlin Ross is a 82 y.o. female who presents after a fall. Reportedly the fall as mechanical. Patient does not complain of anything at this time but clearly this is limited by her dementia. She does not appear to be on any blood thinners   Past Medical History:  Diagnosis Date  . Alzheimer's disease   . Asthma   . Atrophic vaginitis   . Dementia   . Hypertension   . Urinary tract infection     Patient Active Problem List   Diagnosis Date Noted  . Recurrent UTI 05/01/2015  . Allergic state 01/29/2015  . Asthma without status asthmaticus 01/29/2015  . Dementia 01/29/2015  . BP (high blood pressure) 01/29/2015  . Arthritis of shoulder region, degenerative 10/22/2014  . Arthritis of knee, degenerative 10/22/2014  . Herpes zona 10/22/2014    Past Surgical History:  Procedure Laterality Date  . No surgical Hx      Prior to Admission medications   Medication Sig Start Date End Date Taking? Authorizing Provider  Ascorbic Acid (C-500) 500 MG CHEW Chew by mouth.    Historical Provider, MD  aspirin 81 MG chewable tablet Chew 81 mg by mouth daily.    Historical Provider, MD  Cranberry 500 MG CAPS Take by mouth.    Historical Provider, MD  guaiFENesin-codeine (ROBITUSSIN AC) 100-10 MG/5ML syrup Take by mouth. Reported on 04/29/2015 01/22/15   Historical Provider, MD  ondansetron (ZOFRAN ODT) 4 MG disintegrating tablet Take 1 tablet (4 mg total) by mouth every 8 (eight) hours as needed for nausea or vomiting. Patient not taking: Reported on 04/29/2015 12/04/14   Jennye Moccasin, MD     Allergies Review of patient's allergies indicates no known allergies.  Family History  Problem Relation Age of Onset  . Lung cancer  Father   . Stroke Mother   . Hyperlipidemia Sister   . Kidney disease Neg Hx     Social History Social History  Substance Use Topics  . Smoking status: Never Smoker  . Smokeless tobacco: Never Used  . Alcohol use No    Level V caveat: Unable to obtain review of systems due to patient's advanced dementia    ____________________________________________   PHYSICAL EXAM:  VITAL SIGNS: ED Triage Vitals [10/19/15 1804]  Enc Vitals Group     BP (!) 191/87     Pulse Rate (!) 109     Resp 20     Temp 98.6 F (37 C)     Temp Source Oral     SpO2 91 %     Weight      Height      Head Circumference      Peak Flow      Pain Score      Pain Loc      Pain Edu?      Excl. in GC?     Constitutional: Alert. No acute distress.  Eyes: Conjunctivae are normal.  Head: Atraumatic. Nose: No congestion/rhinnorhea. Mouth/Throat: Mucous membranes are moist.   Neck:  Painless ROM, No vertebral tenderness to palpation Cardiovascular: Tachycardia,  Grossly normal heart sounds.  Good peripheral circulation. Respiratory: Normal respiratory effort.  No retractions. Lungs CTAB. Gastrointestinal: Soft and nontender. No distention.  No CVA tenderness. Genitourinary: deferred  Musculoskeletal: Normal range of motion of the upper extremities. Patient has pain with axial load on the right hip. She is unable to move the right leg without pain in the right hip. No pelvic tenderness to palpation. Normal range of motion of the left leg. 2+ distal pulses in the lower extremities Neurologic:  Normal speech and language. No gross focal neurologic deficits are appreciated.  Skin:  Skin is warm, dry and intact. No rash noted. Psychiatric: Patient is calm and cooperative  ____________________________________________   LABS (all labs ordered are listed, but only abnormal results are displayed)  Labs Reviewed  BASIC METABOLIC PANEL - Abnormal; Notable for the following:       Result Value   Glucose,  Bld 108 (*)    GFR calc non Af Amer 60 (*)    All other components within normal limits  CBC WITH DIFFERENTIAL/PLATELET - Abnormal; Notable for the following:    WBC 16.4 (*)    Neutro Abs 12.5 (*)    All other components within normal limits  PROTIME-INR  TYPE AND SCREEN   ____________________________________________  EKG  ED ECG REPORT I, Jene EveryKINNER, Lael Wetherbee, the attending physician, personally viewed and interpreted this ECG.  Date: 10/19/2015  Rate: 126 Rhythm: sinus tachycardia QRS Axis: Left Axis deviation Intervals: normal ST/T Wave abnormalities: None specific changes Conduction Disturbances: none   ____________________________________________  RADIOLOGY  Right subcapital hip fracture ____________________________________________   PROCEDURES  Procedure(s) performed: No    Critical Care performed: No ____________________________________________   INITIAL IMPRESSION / ASSESSMENT AND PLAN / ED COURSE  Pertinent labs & imaging results that were available during my care of the patient were reviewed by me and considered in my medical decision making (see chart for details).  Concern for right-sided hip fracture, images pending  Clinical Course  X-ray consistent with right hip fracture, discussed with Dr. Martha ClanKrasinski of orthopedics, plan is to take her to the operating room tomorrow morning. Pain is well controlled ____________________________________________   FINAL CLINICAL IMPRESSION(S) / ED DIAGNOSES  Final diagnoses:  Hip fracture, right, closed, initial encounter (HCC)      NEW MEDICATIONS STARTED DURING THIS VISIT:  New Prescriptions   No medications on file     Note:  This document was prepared using Dragon voice recognition software and may include unintentional dictation errors.    Jene Everyobert Dayron Odland, MD 10/19/15 2004

## 2015-10-20 ENCOUNTER — Encounter: Payer: Self-pay | Admitting: *Deleted

## 2015-10-20 ENCOUNTER — Encounter
Admission: EM | Disposition: A | Discharge status: Skilled Nursing Facility | Payer: Self-pay | Source: Home / Self Care | Attending: Internal Medicine

## 2015-10-20 ENCOUNTER — Inpatient Hospital Stay: Payer: Medicare Other

## 2015-10-20 ENCOUNTER — Inpatient Hospital Stay: Payer: Medicare Other | Admitting: Anesthesiology

## 2015-10-20 HISTORY — PX: HIP ARTHROPLASTY: SHX981

## 2015-10-20 LAB — CBC
HEMATOCRIT: 34.5 % — AB (ref 35.0–47.0)
HEMOGLOBIN: 11.8 g/dL — AB (ref 12.0–16.0)
MCH: 31.1 pg (ref 26.0–34.0)
MCHC: 34.2 g/dL (ref 32.0–36.0)
MCV: 91.1 fL (ref 80.0–100.0)
Platelets: 227 10*3/uL (ref 150–440)
RBC: 3.79 MIL/uL — ABNORMAL LOW (ref 3.80–5.20)
RDW: 13.1 % (ref 11.5–14.5)
WBC: 12 10*3/uL — AB (ref 3.6–11.0)

## 2015-10-20 LAB — BASIC METABOLIC PANEL
ANION GAP: 4 — AB (ref 5–15)
BUN: 18 mg/dL (ref 6–20)
CHLORIDE: 106 mmol/L (ref 101–111)
CO2: 27 mmol/L (ref 22–32)
Calcium: 8.4 mg/dL — ABNORMAL LOW (ref 8.9–10.3)
Creatinine, Ser: 0.77 mg/dL (ref 0.44–1.00)
GFR calc non Af Amer: >60 mL/min (ref >60)
Glucose, Bld: 118 mg/dL — ABNORMAL HIGH (ref 65–99)
Potassium: 3.9 mmol/L (ref 3.5–5.1)
Sodium: 137 mmol/L (ref 135–145)

## 2015-10-20 SURGERY — HEMIARTHROPLASTY, HIP, DIRECT ANTERIOR APPROACH, FOR FRACTURE
Anesthesia: Spinal | Laterality: Right

## 2015-10-20 MED ORDER — ACETAMINOPHEN 325 MG PO TABS
650.0000 mg | ORAL_TABLET | Freq: Four times a day (QID) | ORAL | Status: DC | PRN
  Administered 2015-10-20: 650 mg via ORAL
  Filled 2015-10-20: qty 2

## 2015-10-20 MED ORDER — FENTANYL CITRATE (PF) 100 MCG/2ML IJ SOLN
25.0000 ug | INTRAMUSCULAR | Status: DC | PRN
  Administered 2015-10-20: 25 ug via INTRAVENOUS

## 2015-10-20 MED ORDER — BUPIVACAINE HCL (PF) 0.5 % IJ SOLN
INTRAMUSCULAR | Status: DC | PRN
  Administered 2015-10-20: 2.5 mL

## 2015-10-20 MED ORDER — MENTHOL 3 MG MT LOZG
1.0000 | LOZENGE | OROMUCOSAL | Status: DC | PRN
  Filled 2015-10-20: qty 9

## 2015-10-20 MED ORDER — METHOCARBAMOL 500 MG PO TABS: 500.0000 mg | ORAL_TABLET | Freq: Four times a day (QID) | ORAL | Status: DC | PRN

## 2015-10-20 MED ORDER — FENTANYL CITRATE (PF) 100 MCG/2ML IJ SOLN
INTRAMUSCULAR | Status: DC | PRN
  Administered 2015-10-20 (×2): 25 ug via INTRAVENOUS

## 2015-10-20 MED ORDER — BISACODYL 10 MG RE SUPP: 10.0000 mg | Freq: Every day | RECTAL | Status: DC | PRN

## 2015-10-20 MED ORDER — PHENYLEPHRINE HCL 10 MG/ML IJ SOLN
INTRAMUSCULAR | Status: DC | PRN
  Administered 2015-10-20: 200 ug via INTRAVENOUS
  Administered 2015-10-20 (×7): 100 ug via INTRAVENOUS

## 2015-10-20 MED ORDER — PROPOFOL 500 MG/50ML IV EMUL
INTRAVENOUS | Status: DC | PRN
  Administered 2015-10-20: 30 ug/kg/min via INTRAVENOUS

## 2015-10-20 MED ORDER — CEFAZOLIN SODIUM-DEXTROSE 2-4 GM/100ML-% IV SOLN
2.0000 g | Freq: Four times a day (QID) | INTRAVENOUS | Status: AC
  Administered 2015-10-20 – 2015-10-21 (×2): 2 g via INTRAVENOUS
  Filled 2015-10-20 (×3): qty 100

## 2015-10-20 MED ORDER — DOCUSATE SODIUM 100 MG PO CAPS
100.0000 mg | ORAL_CAPSULE | Freq: Two times a day (BID) | ORAL | Status: DC
  Administered 2015-10-20 – 2015-10-22 (×2): 100 mg via ORAL
  Filled 2015-10-20 (×7): qty 1

## 2015-10-20 MED ORDER — SODIUM CHLORIDE 0.9 % IV SOLN
INTRAVENOUS | Status: DC | PRN
  Administered 2015-10-20: 20 ug/min via INTRAVENOUS

## 2015-10-20 MED ORDER — METHOCARBAMOL 1000 MG/10ML IJ SOLN
500.0000 mg | Freq: Four times a day (QID) | INTRAVENOUS | Status: DC | PRN
  Filled 2015-10-20: qty 5

## 2015-10-20 MED ORDER — CEFAZOLIN SODIUM-DEXTROSE 2-4 GM/100ML-% IV SOLN
2.0000 g | INTRAVENOUS | Status: AC
  Administered 2015-10-20: 2 g via INTRAVENOUS
  Filled 2015-10-20: qty 100

## 2015-10-20 MED ORDER — ONDANSETRON HCL 4 MG/2ML IJ SOLN: 4.0000 mg | Freq: Once | INTRAMUSCULAR | Status: DC | PRN

## 2015-10-20 MED ORDER — SODIUM CHLORIDE 0.9 % IV SOLN: INTRAVENOUS | Status: DC | PRN

## 2015-10-20 MED ORDER — ALUM & MAG HYDROXIDE-SIMETH 200-200-20 MG/5ML PO SUSP: 30.0000 mL | ORAL | Status: DC | PRN

## 2015-10-20 MED ORDER — ONDANSETRON HCL 4 MG/2ML IJ SOLN: 4.0000 mg | Freq: Four times a day (QID) | INTRAMUSCULAR | Status: DC | PRN

## 2015-10-20 MED ORDER — NEOMYCIN-POLYMYXIN B GU 40-200000 IR SOLN
Status: DC | PRN
  Administered 2015-10-20: 16 mL

## 2015-10-20 MED ORDER — PROPOFOL 10 MG/ML IV BOLUS
INTRAVENOUS | Status: DC | PRN
Start: 2015-10-20 — End: 2015-10-21
  Administered 2015-10-20: 20 mg via INTRAVENOUS

## 2015-10-20 MED ORDER — KETAMINE HCL 10 MG/ML IJ SOLN
INTRAMUSCULAR | Status: DC | PRN
  Administered 2015-10-20 (×2): 20 mg via INTRAVENOUS
  Administered 2015-10-20 (×2): 10 mg via INTRAVENOUS

## 2015-10-20 MED ORDER — ONDANSETRON HCL 4 MG PO TABS: 4.0000 mg | ORAL_TABLET | Freq: Four times a day (QID) | ORAL | Status: DC | PRN

## 2015-10-20 MED ORDER — PHENOL 1.4 % MT LIQD
1.0000 | OROMUCOSAL | Status: DC | PRN
  Filled 2015-10-20: qty 177

## 2015-10-20 MED ORDER — MORPHINE SULFATE (PF) 2 MG/ML IV SOLN: 2.0000 mg | INTRAVENOUS | Status: DC | PRN

## 2015-10-20 MED ORDER — MIDAZOLAM HCL 5 MG/5ML IJ SOLN
INTRAMUSCULAR | Status: DC | PRN
  Administered 2015-10-20: .5 mg via INTRAVENOUS

## 2015-10-20 MED ORDER — SODIUM CHLORIDE 0.9 % IV SOLN
75.0000 mL/h | INTRAVENOUS | Status: DC
  Administered 2015-10-20 – 2015-10-22 (×4): 75 mL/h via INTRAVENOUS

## 2015-10-20 MED ORDER — POLYETHYLENE GLYCOL 3350 17 G PO PACK: 17.0000 g | PACK | Freq: Every day | ORAL | Status: DC | PRN

## 2015-10-20 MED ORDER — MAGNESIUM CITRATE PO SOLN
1.0000 | Freq: Once | ORAL | Status: DC | PRN
  Filled 2015-10-20: qty 296

## 2015-10-20 MED ORDER — ENOXAPARIN SODIUM 30 MG/0.3ML ~~LOC~~ SOLN
30.0000 mg | SUBCUTANEOUS | Status: DC
  Administered 2015-10-21 – 2015-10-23 (×3): 30 mg via SUBCUTANEOUS
  Filled 2015-10-20 (×3): qty 0.3

## 2015-10-20 MED ORDER — EPHEDRINE SULFATE 50 MG/ML IJ SOLN
INTRAMUSCULAR | Status: DC | PRN
  Administered 2015-10-20: 10 mg via INTRAVENOUS

## 2015-10-20 MED ORDER — OXYCODONE HCL 5 MG PO TABS
5.0000 mg | ORAL_TABLET | ORAL | Status: DC | PRN
  Administered 2015-10-20 – 2015-10-21 (×2): 10 mg via ORAL
  Filled 2015-10-20 (×2): qty 2

## 2015-10-20 MED ORDER — ACETAMINOPHEN 650 MG RE SUPP
650.0000 mg | Freq: Four times a day (QID) | RECTAL | Status: DC | PRN
  Administered 2015-10-22: 650 mg via RECTAL
  Filled 2015-10-20: qty 1

## 2015-10-20 MED ORDER — FERROUS SULFATE 325 (65 FE) MG PO TABS
325.0000 mg | ORAL_TABLET | Freq: Three times a day (TID) | ORAL | Status: DC
  Administered 2015-10-22: 325 mg via ORAL
  Filled 2015-10-20 (×3): qty 1

## 2015-10-20 MED ORDER — SODIUM CHLORIDE FLUSH 0.9 % IV SOLN
INTRAVENOUS | Status: AC
  Filled 2015-10-20: qty 10

## 2015-10-20 SURGICAL SUPPLY — 54 items
BLADE SAGITTAL WIDE XTHICK NO (BLADE) ×3 IMPLANT
BLADE SURG SZ10 CARB STEEL (BLADE) ×3 IMPLANT
CANISTER SUCT 1200ML W/VALVE (MISCELLANEOUS) ×3 IMPLANT
CANISTER SUCT 3000ML PPV (MISCELLANEOUS) ×6 IMPLANT
CAPT HIP HEMI 1 ×3 IMPLANT
DRAPE IMP U-DRAPE 54X76 (DRAPES) ×3 IMPLANT
DRAPE INCISE IOBAN 66X60 STRL (DRAPES) IMPLANT
DRAPE SHEET LG 3/4 BI-LAMINATE (DRAPES) ×3 IMPLANT
DRAPE SURG 17X11 SM STRL (DRAPES) ×3 IMPLANT
DRAPE TABLE BACK 80X90 (DRAPES) ×3 IMPLANT
DRSG OPSITE POSTOP 4X10 (GAUZE/BANDAGES/DRESSINGS) ×3 IMPLANT
DURAPREP 26ML APPLICATOR (WOUND CARE) ×12 IMPLANT
ELECT BLADE 6.5 EXT (BLADE) ×3 IMPLANT
ELECT CAUTERY BLADE 6.4 (BLADE) ×3 IMPLANT
ELECT REM PT RETURN 9FT ADLT (ELECTROSURGICAL) ×3
ELECTRODE REM PT RTRN 9FT ADLT (ELECTROSURGICAL) ×1 IMPLANT
GAUZE PETRO XEROFOAM 1X8 (MISCELLANEOUS) ×3 IMPLANT
GAUZE SPONGE 4X4 12PLY STRL (GAUZE/BANDAGES/DRESSINGS) IMPLANT
GLOVE BIO SURGEON STRL SZ7 (GLOVE) ×6 IMPLANT
GLOVE BIO SURGEON STRL SZ7.5 (GLOVE) ×3 IMPLANT
GLOVE BIOGEL PI IND STRL 8 (GLOVE) ×1 IMPLANT
GLOVE BIOGEL PI IND STRL 9 (GLOVE) ×1 IMPLANT
GLOVE BIOGEL PI INDICATOR 8 (GLOVE) ×2
GLOVE BIOGEL PI INDICATOR 9 (GLOVE) ×2
GLOVE INDICATOR 7.5 STRL GRN (GLOVE) ×6 IMPLANT
GLOVE SURG 9.0 ORTHO LTXF (GLOVE) ×3 IMPLANT
GOWN STRL REUS TWL 2XL XL LVL4 (GOWN DISPOSABLE) ×3 IMPLANT
GOWN STRL REUS W/ TWL LRG LVL3 (GOWN DISPOSABLE) ×3 IMPLANT
GOWN STRL REUS W/TWL LRG LVL3 (GOWN DISPOSABLE) ×6
HANDPIECE INTERPULSE COAX TIP (DISPOSABLE) ×2
HEMOVAC 400ML (MISCELLANEOUS)
HIP CAPITATED HEMI 1 ×1 IMPLANT
HOLDER FOLEY CATH W/STRAP (MISCELLANEOUS) ×3 IMPLANT
KIT DRAIN HEMOVAC JP 7FR 400ML (MISCELLANEOUS) IMPLANT
KIT RM TURNOVER STRD PROC AR (KITS) ×3 IMPLANT
NDL SAFETY 18GX1.5 (NEEDLE) ×3 IMPLANT
NEEDLE FILTER BLUNT 18X 1/2SAF (NEEDLE) ×2
NEEDLE FILTER BLUNT 18X1 1/2 (NEEDLE) ×1 IMPLANT
NEEDLE MAYO CATGUT SZ4 (NEEDLE) ×3 IMPLANT
NS IRRIG 1000ML POUR BTL (IV SOLUTION) ×3 IMPLANT
PACK HIP PROSTHESIS (MISCELLANEOUS) ×3 IMPLANT
PILLOW ABDUC SM (MISCELLANEOUS) ×3 IMPLANT
RETRIEVER SUT HEWSON (MISCELLANEOUS) IMPLANT
SET HNDPC FAN SPRY TIP SCT (DISPOSABLE) ×1 IMPLANT
SOL .9 NS 3000ML IRR  AL (IV SOLUTION) ×2
SOL .9 NS 3000ML IRR UROMATIC (IV SOLUTION) ×1 IMPLANT
STAPLER SKIN PROX 35W (STAPLE) ×3 IMPLANT
SUT MNCRL 3 0 RB1 (SUTURE) IMPLANT
SUT MONOCRYL 3 0 RB1 (SUTURE)
SUT TICRON 2-0 30IN 311381 (SUTURE) ×9 IMPLANT
SUT VIC AB 0 CT1 36 (SUTURE) ×6 IMPLANT
SUT VIC AB 2-0 CT2 27 (SUTURE) ×6 IMPLANT
SYRINGE 10CC LL (SYRINGE) ×3 IMPLANT
TAPE MICROFOAM 4IN (TAPE) IMPLANT

## 2015-10-20 NOTE — Anesthesia Procedure Notes (Signed)
Spinal  Patient location during procedure: OR Start time: 10/20/2015 11:35 AM End time: 10/20/2015 11:47 AM Reason for block: at surgeon's request Staffing Anesthesiologist: Elijio MilesVAN STAVEREN, Shammara Jarrett F Performed: anesthesiologist  Preanesthetic Checklist Completed: patient identified, site marked, surgical consent, pre-op evaluation, timeout performed, IV checked, risks and benefits discussed and monitors and equipment checked Spinal Block Patient position: right lateral decubitus Prep: Betadine Patient monitoring: heart rate and blood pressure Approach: midline Location: L3-4 Injection technique: single-shot Needle Needle type: Quincke  Needle gauge: 22 G Needle length: 9 cm Needle insertion depth: 7 cm Assessment Sensory level: T8

## 2015-10-20 NOTE — OR Nursing (Signed)
POA papers copied and on the chart

## 2015-10-20 NOTE — Progress Notes (Signed)
[facility] - [place] at [facility]   PATIENT NAME: Caitlin Ross    MR#:  [id number]  DATE OF BIRTH:  18-Oct-1922  SUBJECTIVE:  CHIEF COMPLAINT:   Chief Complaint  Patient presents with  . Fall   - seen after right hip hemiarthroplasty, patient sedated now, BP borderline low. No complications otherwise with surgery  REVIEW OF SYSTEMS:  Review of Systems  Unable to perform ROS: Dementia    DRUG ALLERGIES:  No Known Allergies  VITALS:  Blood pressure (!) 93/48, pulse 79, temperature 98.4 F (36.9 C), resp. rate 14, height 5\' 6"  (1.676 m), weight 57 kg (125 lb 11.2 oz), SpO2 100 %.  PHYSICAL EXAMINATION:  Physical Exam  GENERAL:  82 y.o.-year-old elderly patient lying in the bed with no acute distress.  EYES: Pupils equal, round, reactive to light and accommodation. No scleral icterus. HEENT: Head atraumatic, normocephalic. Oropharynx and nasopharynx clear.  NECK:  Supple, no jugular venous distention. No thyroid enlargement, no tenderness.  LUNGS: Normal breath sounds bilaterally, no wheezing, rales,rhonchi or crepitation. No use of accessory muscles of respiration. Decreased bibasilar breath sounds CARDIOVASCULAR: S1, S2 normal. No murmurs, rubs, or gallops.  ABDOMEN: Soft, nontender, nondistended. Bowel sounds present. No organomegaly or mass.  EXTREMITIES: No pedal edema, cyanosis, or clubbing. Dressing to right hip NEUROLOGIC: sedated  PSYCHIATRIC: The patient is sedated.  SKIN: No obvious rash, lesion, or ulcer.    LABORATORY PANEL:   CBC  Recent Labs Lab 10/20/15 0431  WBC 12.0*  HGB 11.8*  HCT 34.5*  PLT 227   ------------------------------------------------------------------------------------------------------------------  Chemistries   Recent Labs Lab 10/20/15 0431  NA 137  K 3.9  CL 106  CO2 27  GLUCOSE 118*  BUN 18  CREATININE 0.77  CALCIUM 8.4*    ------------------------------------------------------------------------------------------------------------------  Cardiac Enzymes No results for input(s): TROPONINI in the last 168 hours. ------------------------------------------------------------------------------------------------------------------  RADIOLOGY:  Dg Chest 1 View  Result Date: 10/19/2015 CLINICAL DATA:  Fall.  Right hip fracture EXAM: CHEST 1 VIEW COMPARISON:  12/04/2014 FINDINGS: Heart size within normal limits. Negative for heart failure. Atherosclerotic aortic arch. Lungs are clear without infiltrate or effusion. Degenerative change in both shoulder joints. IMPRESSION: No active disease. Electronically Signed   By: Marlan Palau M.D.   On: 10/19/2015 19:12   Dg Knee Right Port  Result Date: 10/20/2015 CLINICAL DATA:  Worsening right knee pain after fall yesterday. Initial encounter. EXAM: PORTABLE RIGHT KNEE - 1-2 VIEW COMPARISON:  None. FINDINGS: Limited rotated exam.  No demonstrable fracture or dislocation. Diffuse degenerative joint narrowing. Chondrocalcinosis. Stippled calcific density in the suprapatellar joint space region could could be articular bodies or synovial calcification. Osteopenia and atherosclerosis. IMPRESSION: 1. Limited study without acute finding. 2. Advanced osteoarthritis. Electronically Signed   By: Marnee Spring M.D.   On: 10/20/2015 09:33   Dg Hip Unilat With Pelvis 2-3 Views Right  Result Date: 10/19/2015 CLINICAL DATA:  Right hip pain EXAM: DG HIP (WITH OR WITHOUT PELVIS) 2-3V RIGHT COMPARISON:  None. FINDINGS: There is an acute subcapital femoral neck fracture with proximal displacement of the distal fracture fragments. No dislocations identified. IMPRESSION: 1. Acute subcapital femoral neck fracture involves the right proximal femur. Electronically Signed   By: Signa Kell M.D.   On: 10/19/2015 19:12    EKG:   Orders placed or performed during the hospital encounter of 10/19/15   . EKG 12-Lead  . EKG 12-Lead    ASSESSMENT AND PLAN:   82 year old female with past history  of Alzheimer's dementia, hypertension, previous history of UTI who presented to the hospital due to a mechanical fall and noted to have a right hip fracture.  1. Right hip fracture-secondary to mechanical fall. - appreciate ortho consult - s/p hemiarthroplasty today, pain mgmt, physical therapy consult -watch for post op complications- anemia, delirium.  2. Dementia-watch for delirium. Was agitated right after surgery  3. Leukocytosis-stress mediated from her fall and trauma. -f/u.  No acute infectious source. Hold off on antibiotics.  4. DVT Prophylaxis- per ortho- after the surgery  Physical therapy tomorrow   All the records are reviewed and case discussed with Care Management/Social Workerr. Management plans discussed with the patient, family and they are in agreement.  CODE STATUS: full code  TOTAL TIME TAKING CARE OF THIS PATIENT: 34 minutes.   POSSIBLE D/C IN 2 DAYS, DEPENDING ON CLINICAL CONDITION.   Enid BaasKALISETTI,Mattisyn Cardona M.D on 10/20/2015 at 3:27 PM  Between 7am to 6pm - Pager - [phone]  After 6pm go to [email or web address] - Social research officer, governmentpassword [email or web address]  [facility]  Office  [phone]  CC: Primary care physician; Lyndon CodeKHAN, FOZIA M, MD

## 2015-10-20 NOTE — NC FL2 (Signed)
[place] MEDICAID FL2 LEVEL OF CARE SCREENING TOOL     IDENTIFICATION  Patient Name: Caitlin RossettiMalinda L Virani Birthdate: 01/15/1923 Sex: female Admission Date (Current Location): 10/19/2015  [place]   Facility and Address:  [facility], [address], [place], [place] [address]      Provider Number: [id number]  Attending Physician Name and Address:  Enid Baasadhika Kalisetti, MD  Relative Name and Phone Number:       Current Level of Care: Hospital Recommended Level of Care: Skilled Nursing Facility Prior Approval Number:    Date Approved/Denied:   PASRR Number:  [id number])  Discharge Plan: SNF    Current Diagnoses: Patient Active Problem List   Diagnosis Date Noted  . Hip fracture (HCC) 10/19/2015  . Recurrent UTI 05/01/2015  . Allergic state 01/29/2015  . Asthma without status asthmaticus 01/29/2015  . Dementia 01/29/2015  . BP (high blood pressure) 01/29/2015  . Arthritis of shoulder region, degenerative 10/22/2014  . Arthritis of knee, degenerative 10/22/2014  . Herpes zona 10/22/2014    Orientation RESPIRATION BLADDER Height & Weight     Self  Normal External catheter Weight: 125 lb 11.2 oz (57 kg) Height:  5\' 6"  (167.6 cm)  BEHAVIORAL SYMPTOMS/MOOD NEUROLOGICAL BOWEL NUTRITION STATUS   (None. )  (None. ) Continent Diet (Diet: NPO for surgery )  AMBULATORY STATUS COMMUNICATION OF NEEDS Skin   Extensive Assist Verbally Normal                       Personal Care Assistance Level of Assistance  Bathing, Feeding, Dressing Bathing Assistance: Limited assistance Feeding assistance: Independent Dressing Assistance: Limited assistance     Functional Limitations Info  Sight, Hearing, Speech Sight Info: Adequate Hearing Info: Adequate Speech Info: Impaired    SPECIAL CARE FACTORS FREQUENCY  PT (By licensed PT), OT (By licensed OT)     PT Frequency:  (5) OT Frequency:  (5)            Contractures       Additional Factors Info  Code Status, Allergies Code Status Info:  (Full Code) Allergies Info:  (No Known Allergies)           Current Medications (10/20/2015):  This is the current hospital active medication list Current Facility-Administered Medications  Medication Dose Route Frequency Provider Last Rate Last Dose  . sodium chloride flush 0.9 % injection           . 0.9 %  sodium chloride infusion   Intravenous Continuous Houston SirenVivek J Sainani, MD 50 mL/hr at 10/20/15 1040    . [MAR Hold] acetaminophen (TYLENOL) tablet 650 mg  650 mg Oral Q6H PRN Houston SirenVivek J Sainani, MD       Or  . Mitzi Hansen[MAR Hold] acetaminophen (TYLENOL) suppository 650 mg  650 mg Rectal Q6H PRN Houston SirenVivek J Sainani, MD      . Mitzi Hansen[MAR Hold] aspirin chewable tablet 81 mg  81 mg Oral Daily Houston SirenVivek J Sainani, MD   81 mg at 10/19/15 2235  . ceFAZolin (ANCEF) IVPB 2g/100 mL premix  2 g Intravenous 30 min Pre-Op Juanell FairlyKevin Krasinski, MD      . Mitzi Hansen[MAR Hold] methocarbamol (ROBAXIN) tablet 500 mg  500 mg Oral Q6H PRN Juanell FairlyKevin Krasinski, MD       Or  . Mitzi Hansen[MAR Hold] methocarbamol (ROBAXIN) 500 mg in dextrose 5 % 50 mL IVPB  500 mg Intravenous Q6H PRN Juanell FairlyKevin Krasinski, MD      . [  MAR Hold] morphine 2 MG/ML injection 1 mg  1 mg Intravenous Q4H PRN Houston Siren, MD      . Mitzi Hansen Hold] morphine 2 MG/ML injection 2 mg  2 mg Intravenous Q2H PRN Juanell Fairly, MD      . Mitzi Hansen Hold] ondansetron [facility]) tablet 4 mg  4 mg Oral Q6H PRN Houston Siren, MD       Or  . Mitzi Hansen Hold] ondansetron (ZOFRAN) injection 4 mg  4 mg Intravenous Q6H PRN Houston Siren, MD      . Mitzi Hansen Hold] oxyCODONE (Oxy IR/ROXICODONE) immediate release tablet 5-10 mg  5-10 mg Oral Q3H PRN Juanell Fairly, MD         Discharge Medications: Please see discharge summary for a list of discharge medications.  Relevant Imaging Results:  Relevant Lab Results:   Additional Information  (SSN: [id number])  Ralene Bathe, Student-Social Work

## 2015-10-20 NOTE — Anesthesia Preprocedure Evaluation (Signed)
Anesthesia Evaluation  Patient identified by MRN, date of birth, ID band Patient confused    Reviewed: Allergy & Precautions, NPO status , Patient's Chart, lab work & pertinent test results  Airway Mallampati: II       Dental  (+) Upper Dentures, Lower Dentures   Pulmonary asthma ,    breath sounds clear to auscultation       Cardiovascular hypertension,  Rhythm:Irregular     Neuro/Psych    GI/Hepatic negative GI ROS, Neg liver ROS,   Endo/Other  negative endocrine ROS  Renal/GU negative Renal ROS     Musculoskeletal   Abdominal Normal abdominal exam  (+)   Peds  Hematology negative hematology ROS (+)   Anesthesia Other Findings   Reproductive/Obstetrics                             Anesthesia Physical Anesthesia Plan  ASA: III  Anesthesia Plan: Spinal   Post-op Pain Management:    Induction: Intravenous  Airway Management Planned: Natural Airway and Nasal Cannula  Additional Equipment:   Intra-op Plan:   Post-operative Plan:   Informed Consent: I have reviewed the patients History and Physical, chart, labs and discussed the procedure including the risks, benefits and alternatives for the proposed anesthesia with the patient or authorized representative who has indicated his/her understanding and acceptance.     Plan Discussed with: CRNA  Anesthesia Plan Comments:         Anesthesia Quick Evaluation

## 2015-10-20 NOTE — Transfer of Care (Signed)
Immediate Anesthesia Transfer of Care Note  Patient: Caitlin RossettiMalinda L Ross  Procedure(s) Performed: Procedure(s): ARTHROPLASTY BIPOLAR HIP (HEMIARTHROPLASTY) (Right)  Patient Location: PACU  Anesthesia Type:Spinal  Level of Consciousness: sedated  Airway & Oxygen Therapy: Patient Spontanous Breathing and Patient connected to nasal cannula oxygen  Post-op Assessment: Report given to RN and Post -op Vital signs reviewed and stable  Post vital signs: Reviewed and stable  Last Vitals:  Vitals:   10/20/15 0900 10/20/15 1042  BP:  (!) 99/42  Pulse:  84  Resp:  16  Temp: 37.5 C 36.6 C    Last Pain:  Vitals:   10/20/15 1042  TempSrc: Tympanic         Complications: No apparent anesthesia complications

## 2015-10-20 NOTE — Clinical Social Work Placement (Signed)
   CLINICAL SOCIAL WORK PLACEMENT  NOTE  Date:  10/20/2015  Patient Details  Name: Caitlin RossettiMalinda L Kutz MRN: [id number] Date of Birth: 04/23/1922  Clinical Social Work is seeking post-discharge placement for this patient at the Skilled  Nursing Facility level of care (*CSW will initial, date and re-position this form in  chart as items are completed):  Yes   Patient/family provided with [facility] Clinical Social Work Department's list of facilities offering this level of care within the geographic area requested by the patient (or if unable, by the patient's family).  Yes   Patient/family informed of their freedom to choose among providers that offer the needed level of care, that participate in Medicare, Medicaid or managed care program needed by the patient, have an available bed and are willing to accept the patient.  Yes   Patient/family informed of [facility]'s ownership interest in [facility] and [facility], as well as of the fact that they are under no obligation to receive care at these facilities.  PASRR submitted to EDS on 10/20/15     PASRR number received on 10/20/15     Existing PASRR number confirmed on       FL2 transmitted to all facilities in geographic area requested by pt/family on 10/20/15     FL2 transmitted to all facilities within larger geographic area on       Patient informed that his/her managed care company has contracts with or will negotiate with certain facilities, including the following:            Patient/family informed of bed offers received.  Patient chooses bed at       Physician recommends and patient chooses bed at      Patient to be transferred to   on  .  Patient to be transferred to facility by       Patient family notified on   of transfer.  Name of family member notified:        PHYSICIAN       Additional Comment:    _______________________________________________ Kimerly Rowand, Darleen CrockerBailey M, LCSW 10/20/2015, 10:44 AM

## 2015-10-20 NOTE — Progress Notes (Signed)
Dr Kalisetti in to see pt 

## 2015-10-20 NOTE — Progress Notes (Signed)
Subjective:  POST-OP CHECK:  Patient has Alzheimer's dementia. Is difficult for her to provide an accurate history. Patient reports right hip pain as mild to moderate.  Patient's family is at the bedside including her son.  Objective:   VITALS:   Vitals:   10/20/15 1638 10/20/15 1727 10/20/15 1731 10/20/15 1827  BP: (!) 107/54 (!) 106/38 (!) 99/48 122/60  Pulse: 90 93 93 97  Resp: 16 18  18   Temp: 98.3 F (36.8 C) 97.9 F (36.6 C)    TempSrc: Oral Oral    SpO2: 96% 95% 96% 93%  Weight:      Height:        PHYSICAL EXAM:  Right hip: Patient's right hip dressing has scant amount of sanguinous drainage. Her thigh compartments are soft and compressible. She is wearing stockings and foot pumps. She has palpable pedal pulses. There is difficult to assess her sensation based on her dementia. She also has difficulty following commands and is difficult to assess motor function.   LABS  Results for orders placed or performed during the hospital encounter of 10/19/15 (from the past 24 hour(s))  Type and screen If not already done in ED     Status: None   Collection Time: 10/19/15  8:00 PM  Result Value Ref Range   ABO/RH(D) A POS    Antibody Screen NEG    Sample Expiration 10/22/2015   CBC     Status: Abnormal   Collection Time: 10/20/15  4:31 AM  Result Value Ref Range   WBC 12.0 (H) 3.6 - 11.0 K/uL   RBC 3.79 (L) 3.80 - 5.20 MIL/uL   Hemoglobin 11.8 (L) 12.0 - 16.0 g/dL   HCT 16.134.5 (L) 09.635.0 - 04.547.0 %   MCV 91.1 80.0 - 100.0 fL   MCH 31.1 26.0 - 34.0 pg   MCHC 34.2 32.0 - 36.0 g/dL   RDW 40.913.1 81.111.5 - 91.414.5 %   Platelets 227 150 - 440 K/uL  Basic metabolic panel     Status: Abnormal   Collection Time: 10/20/15  4:31 AM  Result Value Ref Range   Sodium 137 135 - 145 mmol/L   Potassium 3.9 3.5 - 5.1 mmol/L   Chloride 106 101 - 111 mmol/L   CO2 27 22 - 32 mmol/L   Glucose, Bld 118 (H) 65 - 99 mg/dL   BUN 18 6 - 20 mg/dL   Creatinine, Ser 7.820.77 0.44 - 1.00 mg/dL   Calcium 8.4  (L) 8.9 - 10.3 mg/dL   GFR calc non Af Amer >60 >60 mL/min   GFR calc Af Amer >60 >60 mL/min   Anion gap 4 (L) 5 - 15    Dg Chest 1 View  Result Date: 10/19/2015 CLINICAL DATA:  Fall.  Right hip fracture EXAM: CHEST 1 VIEW COMPARISON:  12/04/2014 FINDINGS: Heart size within normal limits. Negative for heart failure. Atherosclerotic aortic arch. Lungs are clear without infiltrate or effusion. Degenerative change in both shoulder joints. IMPRESSION: No active disease. Electronically Signed   By: Marlan Palauharles  Clark M.D.   On: 10/19/2015 19:12   Dg Knee Right Port  Result Date: 10/20/2015 CLINICAL DATA:  Worsening right knee pain after fall yesterday. Initial encounter. EXAM: PORTABLE RIGHT KNEE - 1-2 VIEW COMPARISON:  None. FINDINGS: Limited rotated exam.  No demonstrable fracture or dislocation. Diffuse degenerative joint narrowing. Chondrocalcinosis. Stippled calcific density in the suprapatellar joint space region could could be articular bodies or synovial calcification. Osteopenia and atherosclerosis. IMPRESSION: 1. Limited study without  acute finding. 2. Advanced osteoarthritis. Electronically Signed   By: Marnee Spring M.D.   On: 10/20/2015 09:33   Dg Hip Port Unilat With Pelvis 1v Right  Result Date: 10/20/2015 CLINICAL DATA:  Postop right total hip replacement EXAM: DG HIP (WITH OR WITHOUT PELVIS) 1V PORT RIGHT COMPARISON:  Pelvis films of 10/19/2015 FINDINGS: The femoral and acetabular components of the right total hip replacement appear to be in good position and alignment. No complicating features are seen. The bones are osteopenic. IMPRESSION: Right total hip replacement components in good position. No complicating features. Electronically Signed   By: Dwyane Dee M.D.   On: 10/20/2015 15:37   Dg Hip Unilat With Pelvis 2-3 Views Right  Result Date: 10/19/2015 CLINICAL DATA:  Right hip pain EXAM: DG HIP (WITH OR WITHOUT PELVIS) 2-3V RIGHT COMPARISON:  None. FINDINGS: There is an acute  subcapital femoral neck fracture with proximal displacement of the distal fracture fragments. No dislocations identified. IMPRESSION: 1. Acute subcapital femoral neck fracture involves the right proximal femur. Electronically Signed   By: Signa Kell M.D.   On: 10/19/2015 19:12    Assessment/Plan: Day of Surgery   Active Problems:   Hip fracture [facility])  Patient doing well postop. I reviewed the postoperative x-rays which show the hemiarthroplasty prosthesis well positioned. Patient's leg lengths appear equivalent. Patient is resting comfortably in bed and does not appear to be in significant pain. She will receive 24 hours of postop antibiotics. Her Foley catheter will be removed in the morning. Patient will be evaluated by physical therapy getting tomorrow. She will also begin Lovenox for DVT prophylaxis tomorrow.    Juanell Fairly , MD 10/20/2015, 6:37 PM

## 2015-10-20 NOTE — Anesthesia Procedure Notes (Signed)
Date/Time: 10/20/2015 11:40 AM Performed by: Henrietta HooverPOPE, Enijah Furr Pre-anesthesia Checklist: Patient identified, Emergency Drugs available, Suction available, Patient being monitored and Timeout performed Patient Re-evaluated:Patient Re-evaluated prior to inductionOxygen Delivery Method: Nasal cannula Dental Injury: Teeth and Oropharynx as per pre-operative assessment

## 2015-10-20 NOTE — Consult Note (Signed)
ORTHOPAEDIC CONSULTATION  REQUESTING PHYSICIAN: Enid Baasadhika Kalisetti, MD  Chief Complaint: Right hip pain status post fall  HPI: Caitlin Ross is a 82 y.o. female who complains of  right hip pain after sustaining a fall at home.  Patient was being assisted upstairs by her daughter and son-in-law. Patient caught her foot on the top step causing her to fall. She did not sustain any head trauma and did not lose consciousness. Patient has Alzheimer's dementia. She is unable to provide a history. The history is obtained from her daughter. Patient appears comfortable at this time.  Past Medical History:  Diagnosis Date  . Alzheimer's disease   . Asthma   . Atrophic vaginitis   . Dementia   . Hypertension   . Urinary tract infection    Past Surgical History:  Procedure Laterality Date  . No surgical Hx     Social History   Social History  . Marital status: Widowed    Spouse name: N/A  . Number of children: N/A  . Years of education: N/A   Social History Main Topics  . Smoking status: Never Smoker  . Smokeless tobacco: Never Used  . Alcohol use No  . Drug use: No  . Sexual activity: Not Asked   Other Topics Concern  . None   Social History Narrative  . None   Family History  Problem Relation Age of Onset  . Lung cancer Father   . Stroke Mother   . Hyperlipidemia Sister   . Kidney disease Neg Hx    No Known Allergies Prior to Admission medications   Medication Sig Start Date End Date Taking? Authorizing Provider  Ascorbic Acid (C-500) 500 MG CHEW Chew by mouth.   Yes Historical Provider, MD  aspirin 81 MG chewable tablet Chew 81 mg by mouth daily.   Yes Historical Provider, MD  Cranberry 500 MG CAPS Take by mouth.   Yes Historical Provider, MD  guaiFENesin-codeine (ROBITUSSIN AC) 100-10 MG/5ML syrup Take by mouth. Reported on 04/29/2015 01/22/15   Historical Provider, MD  ondansetron (ZOFRAN ODT) 4 MG disintegrating tablet Take 1 tablet (4 mg total) by mouth every 8  (eight) hours as needed for nausea or vomiting. Patient not taking: Reported on 10/19/2015 12/04/14   Jennye MoccasinBrian S Quigley, MD   Dg Chest 1 View  Result Date: 10/19/2015 CLINICAL DATA:  Fall.  Right hip fracture EXAM: CHEST 1 VIEW COMPARISON:  12/04/2014 FINDINGS: Heart size within normal limits. Negative for heart failure. Atherosclerotic aortic arch. Lungs are clear without infiltrate or effusion. Degenerative change in both shoulder joints. IMPRESSION: No active disease. Electronically Signed   By: Marlan Palauharles  Clark M.D.   On: 10/19/2015 19:12   Dg Hip Unilat With Pelvis 2-3 Views Right  Result Date: 10/19/2015 CLINICAL DATA:  Right hip pain EXAM: DG HIP (WITH OR WITHOUT PELVIS) 2-3V RIGHT COMPARISON:  None. FINDINGS: There is an acute subcapital femoral neck fracture with proximal displacement of the distal fracture fragments. No dislocations identified. IMPRESSION: 1. Acute subcapital femoral neck fracture involves the right proximal femur. Electronically Signed   By: Signa Kellaylor  Stroud M.D.   On: 10/19/2015 19:12    Positive ROS: All other systems have been reviewed and were otherwise negative with the exception of those mentioned in the HPI and as above.  Physical Exam: General: Awake, no acute distress   MUSCULOSKELETAL: Right lower extremity: Patient's skin is intact overlying the right hip. There is no erythema or ecchymosis or swelling. Patient's thigh compartments are soft  and compressible. Patient has TED stockings and foot pumps in place. Patient is spontaneously flexing and extending her toes and dorsiflex and plantar flexing her ankle. She has palpable pedal pulses. The patient indicates she can feel me touching her foot.  Assessment: Right femoral neck hip fracture  Plan: I spoke with the patient's daughter in the hospital room today. She also hemi-speak to her brother by phone. I explained to them about the nature of the patient's fracture. I recommended a hemiarthroplasty as treatment  for this injury. I explained to them the details of the operation as well as the postoperative course. We also discussed the risks and benefits of surgery. They understand the risks include but are not limited to infection requiring removal of the prosthesis, bleeding requiring blood transfusion, nerve or blood vessel injury, fracture, dislocation, persistent hip pain, hardware failure and the need for further surgery. He also understands medical risks include but are not limited to DVT and pulmonary wasn't, myocardial infarction, stroke, pneumonia, respiratory failure and death. They understood these risks and wished to proceed. She has been cleared for surgery by the hospitalist service. I reviewed all labs and radiographs studies in preparation for this case. The daughter explains the patient has been complaining of right knee pain. Additional x-rays will be ordered this morning prior to surgery.    Juanell Fairly, MD    10/20/2015 8:53 AM

## 2015-10-20 NOTE — Clinical Social Work Note (Signed)
Clinical Social Work Assessment  Patient Details  Name: Caitlin Ross MRN: [id number] Date of Birth: 07-23-1922  Date of referral:  10/20/15               Reason for consult:  Facility Placement                Permission sought to share information with:  Oceanographeracility Contact Representative Permission granted to share information::  Yes, Verbal Permission Granted  Name::      Skilled Nursing Facility   Agency::   [facility]   Relationship::     Contact Information:     Housing/Transportation Living arrangements for the past 2 months:  Single Family Home Source of Information:  Adult Children, Power of Attorney Patient Interpreter Needed:  None Criminal Activity/Legal Involvement Pertinent to Current Situation/Hospitalization:  No - Comment as needed Significant Relationships:  Adult Children Lives with:  Self, Adult Children Do you feel safe going back to the place where you live?    Need for family participation in patient care:  Yes (Comment)  Care giving concerns:  Patient lives in [place] and her adult son and daughter take turns caring of her.     Social Worker assessment / plan: Visual merchandiserClinical Social Worker (CSW) reviewed chart and noted that patient is having surgery today. CSW contacted patient's son Caitlin Ross to discuss D/C plan. Per son patient lives in [place] and has Dementia and can't be left alone. Per son he takes care of patient 4 days a week and his sister Caitlin Ross takes care of patient 3 days a week. Per son he is patient's HPOA. CSW explained that PT will work with patient after surgery and will recommend a plan for after care. CSW discussed home health and SNF placement options. Per son he will discuss D/C plan with MD and sister and make a decision about SNF. Son [place] agreeable [place]. SNF list was left in patient's room for son to review.   FL2 complete and faxed out. CSW will continue to follow and assist as needed.   Employment  status:  Disabled (Comment on whether or not currently receiving Disability), Retired Database administratornsurance information:  Managed Medicare PT Recommendations:  Not assessed at this time Information / Referral to community resources:  Skilled Nursing Facility  Patient/Family's Response to care:  Patient's son [place] agreeable [place] [facility] [place].   Patient/Family's Understanding of and Emotional Response to Diagnosis, Current Treatment, and Prognosis: Patient's son was pleasant and thanked CSW for calling.   Emotional Assessment Appearance:  Appears stated age Attitude/Demeanor/Rapport:  Unable to Assess Affect (typically observed):  Unable to Assess Orientation:  Oriented to Self, Fluctuating Orientation (Suspected and/or reported Sundowners) Alcohol / Substance use:  Not Applicable Psych involvement (Current and /or in the community):  No (Comment)  Discharge Needs  Concerns to be addressed:  Discharge Planning Concerns Readmission within the last 30 days:  No Current discharge risk:  Dependent with Mobility Barriers to Discharge:  Continued Medical Work up   [employer], Darleen CrockerBailey M, LCSW 10/20/2015, 10:44 AM

## 2015-10-21 ENCOUNTER — Encounter: Payer: Self-pay | Admitting: Orthopedic Surgery

## 2015-10-21 LAB — CBC
HCT: 28.1 % — ABNORMAL LOW (ref 35.0–47.0)
Hemoglobin: 9.7 g/dL — ABNORMAL LOW (ref 12.0–16.0)
MCH: 31.5 pg (ref 26.0–34.0)
MCHC: 34.7 g/dL (ref 32.0–36.0)
MCV: 90.8 fL (ref 80.0–100.0)
Platelets: 166 10*3/uL (ref 150–440)
RBC: 3.09 MIL/uL — ABNORMAL LOW (ref 3.80–5.20)
RDW: 13.1 % (ref 11.5–14.5)
WBC: 9.9 10*3/uL (ref 3.6–11.0)

## 2015-10-21 LAB — BASIC METABOLIC PANEL
Anion gap: 6 (ref 5–15)
BUN: 18 mg/dL (ref 6–20)
CALCIUM: 7.8 mg/dL — AB (ref 8.9–10.3)
CHLORIDE: 112 mmol/L — AB (ref 101–111)
CO2: 23 mmol/L (ref 22–32)
CREATININE: 0.75 mg/dL (ref 0.44–1.00)
GFR calc non Af Amer: >60 mL/min (ref >60)
Glucose, Bld: 130 mg/dL — ABNORMAL HIGH (ref 65–99)
Potassium: 3.8 mmol/L (ref 3.5–5.1)
SODIUM: 141 mmol/L (ref 135–145)

## 2015-10-21 MED ORDER — TRAMADOL HCL 50 MG PO TABS
50.0000 mg | ORAL_TABLET | Freq: Four times a day (QID) | ORAL | Status: DC | PRN
  Administered 2015-10-23: 50 mg via ORAL
  Filled 2015-10-21: qty 1

## 2015-10-21 NOTE — Anesthesia Postprocedure Evaluation (Signed)
Anesthesia Post Note  Patient: Dianna RossettiMalinda L Shands  Procedure(s) Performed: Procedure(s) (LRB): ARTHROPLASTY BIPOLAR HIP (HEMIARTHROPLASTY) (Right)  Patient location during evaluation: Nursing Unit Anesthesia Type: Spinal Level of consciousness: awake, awake and alert and oriented Pain management: pain level controlled Vital Signs Assessment: post-procedure vital signs reviewed and stable Respiratory status: spontaneous breathing, nonlabored ventilation and respiratory function stable Cardiovascular status: stable Postop Assessment: no headache and no backache Anesthetic complications: no    Last Vitals:  Vitals:   10/20/15 2209 10/21/15 0351  BP: (!) 112/54 107/65  Pulse: 93 99  Resp: 16 16  Temp: (!) 38.7 C (!) 38.2 C    Last Pain:  Vitals:   10/21/15 0351  TempSrc: Axillary  PainSc:                  Lyn RecordsNoles,  Twylia Oka R

## 2015-10-21 NOTE — Progress Notes (Signed)
Subjective:  Postoperative day 1 status post right hip hemiarthroplasty. Patient very sedated today. She is breathing and in no acute distress but cannot be aroused for examination. Family member at the bedside who states that she will respond verbally to attendance and waking her but will not open her eyes or stay awake.  Objective:   VITALS:   Vitals:   10/20/15 2100 10/20/15 2209 10/21/15 0351 10/21/15 0834  BP: (!) 104/53 (!) 112/54 107/65 90/67  Pulse: (!) 103 93 99 93  Resp: 18 16 16 16   Temp: (!) 101.6 F (38.7 C) (!) 101.6 F (38.7 C) (!) 100.8 F (38.2 C)   TempSrc: Axillary Axillary Axillary   SpO2: 91% 94% 92% 92%  Weight:      Height:        PHYSICAL EXAM:  Right lower extremity: Patient has scant amount of serosanguineous drainage. She has mild swelling without erythema or ecchymosis over the right thigh. Her thigh compartments are soft and compressible. She has palpable pedal pulses are full and appears well perfused. Sensation and motor function cannot be determined as the patient is to drowsy to participate with exam.   LABS  Results for orders placed or performed during the hospital encounter of 10/19/15 (from the past 24 hour(s))  CBC     Status: Abnormal   Collection Time: 10/21/15  4:08 AM  Result Value Ref Range   WBC 9.9 3.6 - 11.0 K/uL   RBC 3.09 (L) 3.80 - 5.20 MIL/uL   Hemoglobin 9.7 (L) 12.0 - 16.0 g/dL   HCT 16.128.1 (L) 09.635.0 - 04.547.0 %   MCV 90.8 80.0 - 100.0 fL   MCH 31.5 26.0 - 34.0 pg   MCHC 34.7 32.0 - 36.0 g/dL   RDW 40.913.1 81.111.5 - 91.414.5 %   Platelets 166 150 - 440 K/uL  Basic metabolic panel     Status: Abnormal   Collection Time: 10/21/15  4:08 AM  Result Value Ref Range   Sodium 141 135 - 145 mmol/L   Potassium 3.8 3.5 - 5.1 mmol/L   Chloride 112 (H) 101 - 111 mmol/L   CO2 23 22 - 32 mmol/L   Glucose, Bld 130 (H) 65 - 99 mg/dL   BUN 18 6 - 20 mg/dL   Creatinine, Ser 7.820.75 0.44 - 1.00 mg/dL   Calcium 7.8 (L) 8.9 - 10.3 mg/dL   GFR calc  non Af Amer >60 >60 mL/min   GFR calc Af Amer >60 >60 mL/min   Anion gap 6 5 - 15    Dg Chest 1 View  Result Date: 10/19/2015 CLINICAL DATA:  Fall.  Right hip fracture EXAM: CHEST 1 VIEW COMPARISON:  12/04/2014 FINDINGS: Heart size within normal limits. Negative for heart failure. Atherosclerotic aortic arch. Lungs are clear without infiltrate or effusion. Degenerative change in both shoulder joints. IMPRESSION: No active disease. Electronically Signed   By: Marlan Palauharles  Clark M.D.   On: 10/19/2015 19:12   Dg Knee Right Port  Result Date: 10/20/2015 CLINICAL DATA:  Worsening right knee pain after fall yesterday. Initial encounter. EXAM: PORTABLE RIGHT KNEE - 1-2 VIEW COMPARISON:  None. FINDINGS: Limited rotated exam.  No demonstrable fracture or dislocation. Diffuse degenerative joint narrowing. Chondrocalcinosis. Stippled calcific density in the suprapatellar joint space region could could be articular bodies or synovial calcification. Osteopenia and atherosclerosis. IMPRESSION: 1. Limited study without acute finding. 2. Advanced osteoarthritis. Electronically Signed   By: Marnee SpringJonathon  Watts M.D.   On: 10/20/2015 09:33   Dg Hip  Port Unilat With Pelvis 1v Right  Result Date: 10/20/2015 CLINICAL DATA:  Postop right total hip replacement EXAM: DG HIP (WITH OR WITHOUT PELVIS) 1V PORT RIGHT COMPARISON:  Pelvis films of 10/19/2015 FINDINGS: The femoral and acetabular components of the right total hip replacement appear to be in good position and alignment. No complicating features are seen. The bones are osteopenic. IMPRESSION: Right total hip replacement components in good position. No complicating features. Electronically Signed   By: Dwyane Dee M.D.   On: 10/20/2015 15:37   Dg Hip Unilat With Pelvis 2-3 Views Right  Result Date: 10/19/2015 CLINICAL DATA:  Right hip pain EXAM: DG HIP (WITH OR WITHOUT PELVIS) 2-3V RIGHT COMPARISON:  None. FINDINGS: There is an acute subcapital femoral neck fracture with  proximal displacement of the distal fracture fragments. No dislocations identified. IMPRESSION: 1. Acute subcapital femoral neck fracture involves the right proximal femur. Electronically Signed   By: Signa Kell M.D.   On: 10/19/2015 19:12    Assessment/Plan: 1 Day Post-Op   Active Problems:   Hip fracture (HCC)  Patient's labs and vital signs are stable. She appears comfortable but sedated.  This is likely medication related.  Use narcotic pain medications judiciously. We will defer to hospitalist regarding any other medication changes which may improve her level of sedation. Again physical and occupational therapy when patient more alert. Patient will start Lovenox for DVT prophylaxis today.    Juanell Fairly , MD 10/21/2015, 12:17 PM

## 2015-10-21 NOTE — Progress Notes (Signed)
Physical Therapy Treatment Patient Details Name: Caitlin RossettiMalinda L Ross MRN: [id number] DOB: [birth year] Today's Date: 10/21/2015    History of Present Illness Pt. is a 82 y.o. female who was admitted to [facility] for hemiarthroplasy repair of a Right Hip Fracture s/p fall.    PT Comments    Pt presents with deficits in strength, gait, mobility, transfers, and balance.  Pt demonstrated slightly improved sitting tolerance and balance this session and was able to take one small step at EOB with RW and +2 mod-max A to prevent fall.  Pt remains +2 max A with bed mobility and transfers.  Pt will benefit from PT services to address above deficits for decreased caregiver assistance.     Follow Up Recommendations  SNF     Equipment Recommendations  Rolling walker with 5" wheels    Recommendations for Other Services       Precautions / Restrictions Precautions Precautions: Posterior Hip;Fall Precaution Booklet Issued: Yes (comment) Precaution Comments: Reviewed with pt and pt's son this session. Restrictions Weight Bearing Restrictions: Yes RLE Weight Bearing: Weight bearing as tolerated    Mobility  Bed Mobility Overal bed mobility: +2 for physical assistance             General bed mobility comments: +2 max A with all bed mobility tasks  Transfers Overall transfer level: Needs assistance Equipment used: Rolling walker (2 wheeled) Transfers: Sit to/from Stand Sit to Stand: +2 physical assistance    Multiple sit to stand training with max verbal and tactile cues for sequencing.          Ambulation/Gait Ambulation/Gait assistance: +2 physical assistance Ambulation Distance (Feet): 1 Feet Assistive device: Rolling walker (2 wheeled) Gait Pattern/deviations: Trunk flexed;Decreased step length - left;Decreased step length - right   Gait velocity interpretation: <1.8 ft/sec, indicative of risk for recurrent falls General Gait Details: Pt required min A to keep R hand on RW and +2  mod-max A to prevent fall.  Max verbal cues for pt to take one small step at EOB.   Stairs            Wheelchair Mobility    Modified Rankin (Stroke Patients Only)       Balance Overall balance assessment: Needs assistance Sitting-balance support: Bilateral upper extremity supported Sitting balance-Leahy Scale: Fair       Standing balance-Leahy Scale: Poor                      Cognition Arousal/Alertness: Lethargic   Overall Cognitive Status: History of cognitive impairments - at baseline                      Exercises Other Exercises Other Exercises: Sitting balance and gentle core therex with unsupported sitting at EOB    General Comments        Pertinent Vitals/Pain Pain Assessment:  (Unable to assess secondary to cognition)    Home Living Family/patient expects to be discharged to:: Private residence Living Arrangements: Children (Pt's son and daughter take turns caring for pt) Available Help at Discharge: Family Type of Home: House Home Access: Stairs to enter Entrance Stairs-Rails: None Home Layout: One level Home Equipment:  (Per pt's daughter pt owns a walker but unsure of type)      Prior Function Level of Independence: Needs assistance  Gait / Transfers Assistance Needed: Pt is a poor historian, per pt's daughter pt required CGA with amb and min A with transfers and up/down step  for home entry/exit.       PT Goals (current goals can now be found in the care plan section) Acute Rehab PT Goals PT Goal Formulation: Patient unable to participate in goal setting Time For Goal Achievement: 11/03/15 Potential to Achieve Goals: Fair    Frequency    BID      PT Plan Current plan remains appropriate    Co-evaluation             End of Session Equipment Utilized During Treatment: Gait belt;Oxygen Activity Tolerance: Other (comment) (Pt limited by cognition ) Patient left: in bed;with bed alarm set;with SCD's  reapplied;with call bell/phone within reach;with family/visitor present     Time: 1335-1405 PT Time Calculation (min) (ACUTE ONLY): 30 min  Charges:  $Therapeutic Activity: 23-37 mins                    G Codes:      DElly Modena PT, DPT 10/21/15, 4:07 PM

## 2015-10-21 NOTE — Progress Notes (Signed)
Patient's son Molly MaduroRobert called Visual merchandiserClinical Social Worker (CSW) back. CSW [facility] aware [facility] Son accepted [facility] offer. [facility]. Plan is for [facility]. CSW will continue to follow and assist as needed.   [employer] Hughes IncorporatedBailey Avrie Kedzierski, LCSW [phone]

## 2015-10-21 NOTE — Progress Notes (Signed)
Foley d/c'd at 0540 

## 2015-10-21 NOTE — Progress Notes (Signed)
PT is recommending SNF. Clinical Child psychotherapistocial Worker (CSW) attempted to meet with patient to give bed offers however she was asleep. Patient's sister was at bedside and told CSW to call her son and daughter. CSW attempted to contact patient's son Molly MaduroRobert however he did not answer and a voicemail was left. CSW contacted patient's daughter Britta MccreedyBarbara and presented bed offers. Per daughter she would like for [facility] to [facility]. [facility] has not responded to the referral yet. Per daughter they will consider [facility] if [facility] does not offer a bed. CSW will continue to follow and assist as needed.   [employer]Bailey Jospeh Mangel, LCSW [phone]

## 2015-10-21 NOTE — Evaluation (Signed)
Physical Therapy Evaluation Patient Details Name: Caitlin RossettiMalinda L Ross MRN: [id number] DOB: [birth year] Today's Date: 10/21/2015   History of Present Illness  Pt. is a 82 y.o. female who [facility] for hemiarthroplasy repair of a Right Hip Fracture s/p fall.  Clinical Impression  Pt presents with deficits in strength, gait, balance, mobility, and transfers.  Pt required +2 max A for bed mobility and transfers and was not able to ambulate secondary to pain.  Pt limited by cognition with limited ability to follow commands.  Pt will benefit from PT services to address above deficits for decreased caregiver assistance.     Follow Up Recommendations SNF    Equipment Recommendations  Rolling walker with 5" wheels (Pt's daughter unsure of what type of walker pt owns)    Recommendations for Other Services       Precautions / Restrictions Precautions Precautions: Fall;Posterior Hip Precaution Booklet Issued: Yes (comment) Precaution Comments: Reviewed with pt and pt's daughter Restrictions Weight Bearing Restrictions: Yes RLE Weight Bearing: Weight bearing as tolerated      Mobility  Bed Mobility Overal bed mobility: +2 for physical assistance             General bed mobility comments: +2 max A with all bed mobility tasks  Transfers Overall transfer level: Needs assistance Equipment used: Rolling walker (2 wheeled) Transfers: Sit to/from Stand Sit to Stand: +2 physical assistance            Ambulation/Gait Ambulation/Gait assistance:  (Pt unable to take a step)              Stairs            Wheelchair Mobility    Modified Rankin (Stroke Patients Only)       Balance Overall balance assessment: Needs assistance   Sitting balance-Leahy Scale: Fair       Standing balance-Leahy Scale: Poor                               Pertinent Vitals/Pain Pain Assessment:  (Unable to assess secondary to cognition)    Home Living  Family/patient expects to be discharged to:: Private residence Living Arrangements: Children (Pt's son and daughter take turns caring for pt) Available Help at Discharge: Family Type of Home: House Home Access: Stairs to enter Entrance Stairs-Rails: None Secretary/administratorntrance Stairs-Number of Steps: 1 Home Layout: One level Home Equipment:  (Per pt's daughter pt owns a walker but unsure of type)      Prior Function Level of Independence: Needs assistance   Gait / Transfers Assistance Needed: Pt is a poor historian, per pt's daughter pt required CGA with amb and min A with transfers and up/down step for home entry/exit.  ADL's / Homemaking Assistance Needed: All morning self-care was provided by daughter, and a personal caregiver for bathing, dressing, grooming. Pt. was able to to feed self with complete set-up, and cues.          Hand Dominance   Dominant Hand: Right    Extremity/Trunk Assessment   Upper Extremity Assessment: Difficult to assess due to impaired cognition           Lower Extremity Assessment: Difficult to assess due to impaired cognition;Generalized weakness         Communication   Communication:  (Pt. lethargic)  Cognition Arousal/Alertness: Lethargic   Overall Cognitive Status: History of cognitive impairments - at baseline  General Comments      Exercises Other Exercises Other Exercises: HEP and posterior hip precaution education per hip replacement booklet provided to pt's daughter with pt present but limited by cognition.    Assessment/Plan    PT Assessment Patient needs continued PT services  PT Problem List Decreased strength;Decreased activity tolerance;Decreased balance;Decreased mobility          PT Treatment Interventions DME instruction;Gait training;Stair training;Functional mobility training;Therapeutic activities;Therapeutic exercise;Balance training;Neuromuscular re-education;Patient/family education    PT  Goals (Current goals can be found in the Care Plan section)  Acute Rehab PT Goals PT Goal Formulation: Patient unable to participate in goal setting Time For Goal Achievement: 11/03/15 Potential to Achieve Goals: Fair    Frequency BID   Barriers to discharge        Co-evaluation               End of Session Equipment Utilized During Treatment: Gait belt Activity Tolerance: Patient limited by pain Patient left: in bed;with call bell/phone within reach;with family/visitor present;with bed alarm set;with SCD's reapplied           Time: 0850-0930 PT Time Calculation (min) (ACUTE ONLY): 40 min   Charges:   PT Evaluation $PT Eval Low Complexity: 1 Procedure PT Treatments $Therapeutic Activity: 8-22 mins   PT G Codes:        DElly Modena PT, DPT 10/21/15, 1:10 PM

## 2015-10-21 NOTE — Progress Notes (Signed)
[facility]   PATIENT NAME: Caitlin GallowayMalinda Ross    MR#:  [id number]  DATE OF BIRTH:  04-21-1922  SUBJECTIVE:  CHIEF COMPLAINT:   Chief Complaint  Patient presents with  . Fall   - seen after right hip hemiarthroplasty, POD #1 today - very sedated, received pain medication- oxycodone around 5AM this morning - sister at bedside  REVIEW OF SYSTEMS:  Review of Systems  Unable to perform ROS: Dementia    DRUG ALLERGIES:  No Known Allergies  VITALS:  Blood pressure 90/67, pulse 93, temperature (!) 100.8 F (38.2 C), temperature source Axillary, resp. rate 16, height 5\' 6"  (1.676 m), weight 57 kg (125 lb 11.2 oz), SpO2 92 %.  PHYSICAL EXAMINATION:  Physical Exam  GENERAL:  82 y.o.-year-old elderly patient lying in the bed with no acute distress.  EYES: Pupils equal, round, reactive to light and accommodation. No scleral icterus. HEENT: Head atraumatic, normocephalic. Oropharynx and nasopharynx clear.  NECK:  Supple, no jugular venous distention. No thyroid enlargement, no tenderness.  LUNGS: Normal breath sounds bilaterally, no wheezing, rales,rhonchi or crepitation. No use of accessory muscles of respiration. Decreased bibasilar breath sounds CARDIOVASCULAR: S1, S2 normal. No murmurs, rubs, or gallops.  ABDOMEN: Soft, nontender, nondistended. Bowel sounds present. No organomegaly or mass.  EXTREMITIES: No pedal edema, cyanosis, or clubbing. Dressing to right hip NEUROLOGIC: sedated  PSYCHIATRIC: The patient is sedated.  SKIN: No obvious rash, lesion, or ulcer.    LABORATORY PANEL:   CBC  Recent Labs Lab 10/21/15 0408  WBC 9.9  HGB 9.7*  HCT 28.1*  PLT 166   ------------------------------------------------------------------------------------------------------------------  Chemistries   Recent Labs Lab 10/21/15 0408  NA 141  K 3.8  CL 112*  CO2 23  GLUCOSE 130*  BUN 18  CREATININE 0.75  CALCIUM 7.8*    ------------------------------------------------------------------------------------------------------------------  Cardiac Enzymes No results for input(s): TROPONINI in the last 168 hours. ------------------------------------------------------------------------------------------------------------------  RADIOLOGY:  Dg Chest 1 View  Result Date: 10/19/2015 CLINICAL DATA:  Fall.  Right hip fracture EXAM: CHEST 1 VIEW COMPARISON:  12/04/2014 FINDINGS: Heart size within normal limits. Negative for heart failure. Atherosclerotic aortic arch. Lungs are clear without infiltrate or effusion. Degenerative change in both shoulder joints. IMPRESSION: No active disease. Electronically Signed   By: Marlan Palauharles  Clark M.D.   On: 10/19/2015 19:12   Dg Knee Right Port  Result Date: 10/20/2015 CLINICAL DATA:  Worsening right knee pain after fall yesterday. Initial encounter. EXAM: PORTABLE RIGHT KNEE - 1-2 VIEW COMPARISON:  None. FINDINGS: Limited rotated exam.  No demonstrable fracture or dislocation. Diffuse degenerative joint narrowing. Chondrocalcinosis. Stippled calcific density in the suprapatellar joint space region could could be articular bodies or synovial calcification. Osteopenia and atherosclerosis. IMPRESSION: 1. Limited study without acute finding. 2. Advanced osteoarthritis. Electronically Signed   By: Marnee SpringJonathon  Watts M.D.   On: 10/20/2015 09:33   Dg Hip Port Unilat With Pelvis 1v Right  Result Date: 10/20/2015 CLINICAL DATA:  Postop right total hip replacement EXAM: DG HIP (WITH OR WITHOUT PELVIS) 1V PORT RIGHT COMPARISON:  Pelvis films of 10/19/2015 FINDINGS: The femoral and acetabular components of the right total hip replacement appear to be in good position and alignment. No complicating features are seen. The bones are osteopenic. IMPRESSION: Right total hip replacement components in good position. No complicating features. Electronically Signed   By: Dwyane DeePaul  Barry M.D.   On: 10/20/2015 15:37    Dg Hip Unilat With Pelvis 2-3 Views Right  Result  Date: 10/19/2015 CLINICAL DATA:  Right hip pain EXAM: DG HIP (WITH OR WITHOUT PELVIS) 2-3V RIGHT COMPARISON:  None. FINDINGS: There is an acute subcapital femoral neck fracture with proximal displacement of the distal fracture fragments. No dislocations identified. IMPRESSION: 1. Acute subcapital femoral neck fracture involves the right proximal femur. Electronically Signed   By: Signa Kell M.D.   On: 10/19/2015 19:12    EKG:   Orders placed or performed during the hospital encounter of 10/19/15  . EKG 12-Lead  . EKG 12-Lead    ASSESSMENT AND PLAN:   82 year old female with past history of Alzheimer's dementia, hypertension, previous history of UTI who presented to the hospital due to a mechanical fall and noted to have a right hip fracture.  1. Right hip fracture-secondary to mechanical fall. S/p right hemiarthroplasty POD #1 - change pain medications as patient pretty sedated - change to tramadol for moderate and severe pain - appreciate ortho consult - pain mgmt, physical therapy consult -watch for post op complications- anemia, delirium. Will need rehab  2. Dementia-monitor for delirium.   3. Leukocytosis-stress mediated from her fall and trauma. - resolved.  No acute infectious source. Hold off on antibiotics.  4. DVT Prophylaxis- started on lovenox  Physical therapy consulted.   All the records are reviewed and case discussed with Care Management/Social Workerr. Management plans discussed with the patient, family and they are in agreement.  CODE STATUS: full code  TOTAL TIME TAKING CARE OF THIS PATIENT: 34 minutes.   POSSIBLE D/C IN 1-2 DAYS, DEPENDING ON CLINICAL CONDITION.   Enid Baas M.D on 10/21/2015 at 2:14 PM  Between 7am to 6pm - Pager - [phone]  After 6pm go to [email or web address] - Social research officer, government  [facility]  Office  [phone]  CC: Primary care physician; Lyndon Code, MD

## 2015-10-21 NOTE — Evaluation (Signed)
Occupational Therapy Evaluation Patient Details Name: Caitlin RossettiMalinda L Ross MRN: [id number] DOB: [birth year] Today's Date: 10/21/2015    History of Present Illness Pt. is a 82 y.o. female who was admitted to [facility] for Hemiarthroplasy repair of a Right Hip Fracture.   Clinical Impression   Pt. Is a 82 y.o. Female who was [facility]. Pt. Presents with lethargy, pain, and decreased cognition. At baseline, pt. Daughter and personal caregiver perform all morning care tasks for pt. Pt. Daughter reports pt. assists with self feeding tasks. Pt. Could benefit from skilled OT services for pt./familiy/caregiver education about ADL, hip precautions, positioning, cognitive compensatory strategies, and home modification.    Follow Up Recommendations  SNF    Equipment Recommendations       Recommendations for Other Services PT consult     Precautions / Restrictions Precautions Precautions: Posterior Hip Restrictions RLE Weight Bearing: Weight bearing as tolerated                                                     ADL Overall ADL's : Needs assistance/impaired Eating/Feeding: Total assistance   Grooming: Total assistance           Upper Body Dressing : Total assistance   Lower Body Dressing: Total assistance                 General ADL Comments: Pt. is not a candidate for A/E training at this time.     Vision     Perception     Praxis      Pertinent Vitals/Pain Pain Assessment:  (Unable to assess secondary to cognitive status.)     Hand Dominance Right   Extremity/Trunk Assessment Upper Extremity Assessment Upper Extremity Assessment: Difficult to assess due to impaired cognition           Communication Communication Communication:  (Pt. lethargic)   Cognition Arousal/Alertness: Lethargic   Overall Cognitive Status: History of cognitive impairments - at baseline                     General  Comments       Exercises       Shoulder Instructions      Home Living Family/patient expects to be discharged to:: Private residence Living Arrangements: Alone Available Help at Discharge: Family Type of Home: House (Care was provided between son and daughter.)       Home Layout: One level               Home Equipment:  (Pt. has a walker.)          Prior Functioning/Environment Level of Independence: Needs assistance    ADL's / Homemaking Assistance Needed: All morning self-care was provided by daughter, and a personal caregiver for bathing, dressing, grooming. Pt. was able to to feed self with complete set-up, and cues.              OT Problem List: Decreased cognition;Decreased strength;Decreased activity tolerance;Decreased knowledge of use of DME or AE;Decreased safety awareness;Pain   OT Treatment/Interventions:      OT Goals(Current goals can be found in the care plan section) Acute Rehab OT Goals OT Goal Formulation: Patient unable to participate in goal setting  OT Frequency:     Barriers to D/C:  Co-evaluation              End of Session    Activity Tolerance: Patient limited by lethargy Patient left: in bed;with bed alarm set;with family/visitor present   Time: 7341-9379 OT Time Calculation (min): 12 min Charges:  OT General Charges $OT Visit: 1 Procedure OT Evaluation $OT Eval Low Complexity: 1 Procedure G-Codes:    Olegario Messier, MS, OTR/L 10/21/2015, 12:02 PM

## 2015-10-22 LAB — CBC
HCT: 25.9 % — ABNORMAL LOW (ref 35.0–47.0)
Hemoglobin: 8.9 g/dL — ABNORMAL LOW (ref 12.0–16.0)
MCH: 31.5 pg (ref 26.0–34.0)
MCHC: 34.2 g/dL (ref 32.0–36.0)
MCV: 91.9 fL (ref 80.0–100.0)
PLATELETS: 150 10*3/uL (ref 150–440)
RBC: 2.82 MIL/uL — ABNORMAL LOW (ref 3.80–5.20)
RDW: 13.2 % (ref 11.5–14.5)
WBC: 9.2 10*3/uL (ref 3.6–11.0)

## 2015-10-22 LAB — BASIC METABOLIC PANEL
ANION GAP: 4 — AB (ref 5–15)
BUN: 20 mg/dL (ref 6–20)
CALCIUM: 8 mg/dL — AB (ref 8.9–10.3)
CO2: 24 mmol/L (ref 22–32)
Chloride: 114 mmol/L — ABNORMAL HIGH (ref 101–111)
Creatinine, Ser: 0.71 mg/dL (ref 0.44–1.00)
GFR calc Af Amer: >60 mL/min (ref >60)
Glucose, Bld: 107 mg/dL — ABNORMAL HIGH (ref 65–99)
Potassium: 3.7 mmol/L (ref 3.5–5.1)
Sodium: 142 mmol/L (ref 135–145)

## 2015-10-22 LAB — SURGICAL PATHOLOGY

## 2015-10-22 MED ORDER — ENSURE ENLIVE PO LIQD
237.0000 mL | Freq: Two times a day (BID) | ORAL | Status: DC
  Administered 2015-10-22 – 2015-10-23 (×2): 237 mL via ORAL

## 2015-10-22 MED ORDER — MAGNESIUM HYDROXIDE 400 MG/5ML PO SUSP
30.0000 mL | Freq: Two times a day (BID) | ORAL | Status: AC
  Administered 2015-10-22 (×2): 30 mL via ORAL
  Filled 2015-10-22 (×2): qty 30

## 2015-10-22 MED ORDER — FERROUS SULFATE 325 (65 FE) MG PO TABS
325.0000 mg | ORAL_TABLET | Freq: Three times a day (TID) | ORAL | Status: DC
  Administered 2015-10-23 (×2): 325 mg via ORAL
  Filled 2015-10-22 (×2): qty 1

## 2015-10-22 NOTE — Progress Notes (Signed)
Physical Therapy Treatment Patient Details Name: Caitlin RossettiMalinda L Ross MRN: [id number] DOB: Jan 26, 1922 Today's Date: 10/22/2015    History of Present Illness Pt. is a 82 y.o. female who [facility] hemiarthroplasy repair of a Right Hip Fracture s/p fall.    PT Comments    Patient able to complete OOB to chair this date, though requiring total assist +2 via scoot pivot (over level surfaces).  Very limited ability to actively participate with transfer due to poor task comprehension and command-following with new movement strategy.  Very guarded with any movement of R hip; very limited/no active use of extremity with any functional activity this date. Poor awareness of situation despite constant re-orientation to location, situation and role of therapy.   Follow Up Recommendations  SNF     Equipment Recommendations       Recommendations for Other Services       Precautions / Restrictions Precautions Precautions: Posterior Hip;Fall Restrictions Weight Bearing Restrictions: Yes RLE Weight Bearing: Weight bearing as tolerated    Mobility  Bed Mobility Overal bed mobility: +2 for physical assistance;Needs Assistance Bed Mobility: Supine to Sit     Supine to sit: Total assist;+2 for physical assistance     General bed mobility comments: poor ability to initiate, sequence task; very guarded  Transfers Overall transfer level: Needs assistance Equipment used: Rolling walker (2 wheeled) Transfers: Sit to/from Stand Sit to Stand: Total assist;+2 physical assistance         General transfer comment: unable to initiate without extensive assist from therapist; hand-over-hand for UE placement on RW, total assist for lift off.  Very heavy posterior pushing with transfer efforts; limited WBing and active use of bilat LEs. Unable to achieve full stance this date.  Ambulation/Gait             General Gait Details: unsafe/unable    Stairs            Wheelchair  Mobility    Modified Rankin (Stroke Patients Only)       Balance Overall balance assessment: Needs assistance Sitting-balance support: No upper extremity supported;Feet supported Sitting balance-Leahy Scale: Fair     Standing balance support: Bilateral upper extremity supported Standing balance-Leahy Scale: Zero                      Cognition Arousal/Alertness: Awake/alert Behavior During Therapy: WFL for tasks assessed/performed Overall Cognitive Status: History of cognitive impairments - at baseline (oriented to self only; inconsistently (<50%) follows simple, one-step commands)                      Exercises Other Exercises Other Exercises: Scoot pivot transfer, bed/chair over level surface, total assist +2 for lift off, lateral movement and forward weight shift.  Poor comprehension of task at hand; limited ability to actively participate with transfer.    General Comments        Pertinent Vitals/Pain Pain Assessment: Faces Faces Pain Scale: Hurts whole lot Pain Location: R hip Pain Descriptors / Indicators: Aching;Grimacing;Guarding Pain Intervention(s): Repositioned;Limited activity within patient's tolerance;Monitored during session    Home Living                      Prior Function            PT Goals (current goals can now be found in the care plan section) Acute Rehab PT Goals PT Goal Formulation: Patient unable to participate in goal setting Time  For Goal Achievement: 11/03/15 Potential to Achieve Goals: Fair Progress towards PT goals: Progressing toward goals    Frequency    BID      PT Plan Current plan remains appropriate    Co-evaluation             End of Session Equipment Utilized During Treatment: Gait belt Activity Tolerance: Patient limited by pain (limited by cognitive status) Patient left: in chair;with call bell/phone within reach;with chair alarm set     Time: [phone] PT Time Calculation  (min) (ACUTE ONLY): 18 min  Charges:  $Therapeutic Activity: 8-22 mins                    G Codes:       Caitlin Ross, PT, DPT, NCS 10/22/15, 8:51 AM [phone]

## 2015-10-22 NOTE — Progress Notes (Signed)
Patient had some extra bloody drainage after PT on her honeycomb dressing. Dr. Martha ClanKrasinski made aware Dressing changed.   Harvie HeckMelanie Carmine Youngberg, RN

## 2015-10-22 NOTE — Progress Notes (Signed)
  Subjective:  Asked by RN  to see patient's dressing.  She noted increased drainage after PT today.  Patient sleeping comfortably.   Family at the bedside.  Objective:   VITALS:   Vitals:   10/21/15 2228 10/22/15 0424 10/22/15 0725 10/22/15 1506  BP:  (!) 146/63 125/62 (!) 124/51  Pulse:  97 91 90  Resp:  16 18 14   Temp: 99.9 F (37.7 C) 98.4 F (36.9 C) 98.8 F (37.1 C) 100 F (37.8 C)  TempSrc: Oral Oral Oral Oral  SpO2:  95% 96% 95%  Weight:      Height:        PHYSICAL EXAM:  Right hip dressing has been changed by the RN and remains clean.    LABS  Results for orders placed or performed during the hospital encounter of 10/19/15 (from the past 24 hour(s))  CBC     Status: Abnormal   Collection Time: 10/22/15  4:13 AM  Result Value Ref Range   WBC 9.2 3.6 - 11.0 K/uL   RBC 2.82 (L) 3.80 - 5.20 MIL/uL   Hemoglobin 8.9 (L) 12.0 - 16.0 g/dL   HCT 40.925.9 (L) 81.135.0 - 91.447.0 %   MCV 91.9 80.0 - 100.0 fL   MCH 31.5 26.0 - 34.0 pg   MCHC 34.2 32.0 - 36.0 g/dL   RDW 78.213.2 95.611.5 - 21.314.5 %   Platelets 150 150 - 440 K/uL  Basic metabolic panel     Status: Abnormal   Collection Time: 10/22/15  4:13 AM  Result Value Ref Range   Sodium 142 135 - 145 mmol/L   Potassium 3.7 3.5 - 5.1 mmol/L   Chloride 114 (H) 101 - 111 mmol/L   CO2 24 22 - 32 mmol/L   Glucose, Bld 107 (H) 65 - 99 mg/dL   BUN 20 6 - 20 mg/dL   Creatinine, Ser 0.860.71 0.44 - 1.00 mg/dL   Calcium 8.0 (L) 8.9 - 10.3 mg/dL   GFR calc non Af Amer >60 >60 mL/min   GFR calc Af Amer >60 >60 mL/min   Anion gap 4 (L) 5 - 15    No results found.  Assessment/Plan: 2 Days Post-Op   Active Problems:   Hip fracture (HCC)  Continue PT.  Change dressing prn.  Likely discharge to SNF tomorrow.    Caitlin Ross, Caitlin Ross , MD 10/22/2015, 5:50 PM

## 2015-10-22 NOTE — Progress Notes (Signed)
Subjective:  POD #2 s/p right hip hemiarthroplasty.  Patient sitting up in a chair. Her son is at the bedside. Patient has dementia and is unable to provide an accurate history but appears comfortable. She Explains That She Has Been Able to Tolerate by Mouth Diet.  Objective:   VITALS:   Vitals:   10/21/15 1952 10/21/15 2228 10/22/15 0424 10/22/15 0725  BP: (!) 121/54  (!) 146/63 125/62  Pulse: 96  97 91  Resp: (!) 22  16 18   Temp: (!) 100.6 F (38.1 C) 99.9 F (37.7 C) 98.4 F (36.9 C) 98.8 F (37.1 C)  TempSrc: Oral Oral Oral Oral  SpO2: 93%  95% 96%  Weight:      Height:        PHYSICAL EXAM:  Right hip: Patient has moderate sero-sanguinous drainage on her bandage. There is no active drainage from the incision. Her thigh has mild swelling but the compartments are soft and compressible. She has spontaneous movements to her ankles and toes but is unable to follow commands. Given her dementia is now possible to assess her sensation. She had palpable pedal pulses. She has an abduction pillow in place and she is wearing TED stockings and foot pumps.  LABS  Results for orders placed or performed during the hospital encounter of 10/19/15 (from the past 24 hour(s))  CBC     Status: Abnormal   Collection Time: 10/22/15  4:13 AM  Result Value Ref Range   WBC 9.2 3.6 - 11.0 K/uL   RBC 2.82 (L) 3.80 - 5.20 MIL/uL   Hemoglobin 8.9 (L) 12.0 - 16.0 g/dL   HCT 16.1 (L) 09.6 - 04.5 %   MCV 91.9 80.0 - 100.0 fL   MCH 31.5 26.0 - 34.0 pg   MCHC 34.2 32.0 - 36.0 g/dL   RDW 40.9 81.1 - 91.4 %   Platelets 150 150 - 440 K/uL  Basic metabolic panel     Status: Abnormal   Collection Time: 10/22/15  4:13 AM  Result Value Ref Range   Sodium 142 135 - 145 mmol/L   Potassium 3.7 3.5 - 5.1 mmol/L   Chloride 114 (H) 101 - 111 mmol/L   CO2 24 22 - 32 mmol/L   Glucose, Bld 107 (H) 65 - 99 mg/dL   BUN 20 6 - 20 mg/dL   Creatinine, Ser 7.82 0.44 - 1.00 mg/dL   Calcium 8.0 (L) 8.9 - 10.3 mg/dL    GFR calc non Af Amer >60 >60 mL/min   GFR calc Af Amer >60 >60 mL/min   Anion gap 4 (L) 5 - 15    Dg Hip Port Unilat With Pelvis 1v Right  Result Date: 10/20/2015 CLINICAL DATA:  Postop right total hip replacement EXAM: DG HIP (WITH OR WITHOUT PELVIS) 1V PORT RIGHT COMPARISON:  Pelvis films of 10/19/2015 FINDINGS: The femoral and acetabular components of the right total hip replacement appear to be in good position and alignment. No complicating features are seen. The bones are osteopenic. IMPRESSION: Right total hip replacement components in good position. No complicating features. Electronically Signed   By: Dwyane Dee M.D.   On: 10/20/2015 15:37    Assessment/Plan: 2 Days Post-Op   Active Problems:   Hip fracture [facility])  Patient is improving postop. She is more alert today. Patient was able to get up out of bed to a chair. Continue physical therapy. Patient on Lovenox for DVT prophylaxis. She will likely be ready for discharge to skilled nursing facility  tomorrow.    [name], Emmilynn Marut , MD 10/22/2015, 1:10 PM

## 2015-10-22 NOTE — Progress Notes (Signed)
Physical Therapy Treatment Patient Details Name: Caitlin Ross MRN: [id number] DOB: [birth year] Today's Date: 10/22/2015    History of Present Illness Pt. is a 82 y.o. female who was admitted to [facility] for hemiarthroplasy repair of a Right Hip Fracture s/p fall.    PT Comments    Absent carry-over of information, orientation between sessions.  Continues to require max/dep assist +2 for all mobility; unable to complete sit/stand or initiate standing/gait efforts due to heavy posterior weight shift/trunk lean with all upright postures. Difficulty following commands throughout session; optimal performance noted with automatic, functional tasks.   Follow Up Recommendations  SNF     Equipment Recommendations  Rolling walker with 5" wheels    Recommendations for Other Services       Precautions / Restrictions Precautions Precautions: Posterior Hip;Fall Restrictions Weight Bearing Restrictions: Yes RLE Weight Bearing: Weight bearing as tolerated    Mobility  Bed Mobility Overal bed mobility: +2 for physical assistance;Needs Assistance Bed Mobility: Sit to Supine       Sit to supine: Total assist;+2 for physical assistance   General bed mobility comments: total assist for LE management, trunck control  Transfers Overall transfer level: Needs assistance   Transfers: Lateral/Scoot Transfers Sit to Stand: Total assist;+2 physical assistance         General transfer comment: full assist from therapist for forward trunk lean, lift off and lateral movement between seating surfaces  Ambulation/Gait             General Gait Details: unsafe/unable    Stairs            Wheelchair Mobility    Modified Rankin (Stroke Patients Only)       Balance Overall balance assessment: Needs assistance Sitting-balance support: No upper extremity supported;Feet supported Sitting balance-Leahy Scale: Fair     Standing balance support: Bilateral upper extremity  supported Standing balance-Leahy Scale: Zero Standing balance comment: very heavy posterior trunk lean; unable to attain midline in A/P plane                    Cognition Arousal/Alertness: Awake/alert Behavior During Therapy: WFL for tasks assessed/performed Overall Cognitive Status: History of cognitive impairments - at baseline                      Exercises Other Exercises Other Exercises: Sit/stand from elevated bed surface, max/total assist +2, for lift off, standing balance.  Heavy posterior lean, bilat ankles PF with bilat hip flex throughotu. Unable to achieve erect stance despite verbal/tactile cuing x3 trials Other Exercises: Rolling bilat, max/dep, for hygiene after incontinent BM.  Dep for awareness/adherence to THPs with all mobility efforts.  Moderate amount of sanguinous drainage noted to R hip incision; RN informed/aware.    General Comments        Pertinent Vitals/Pain Pain Assessment: Faces Faces Pain Scale: Hurts whole lot Pain Location: R hip Pain Descriptors / Indicators: Aching;Grimacing;Guarding;Moaning Pain Intervention(s): Limited activity within patient's tolerance;Monitored during session;Repositioned    Home Living                      Prior Function            PT Goals (current goals can now be found in the care plan section) Acute Rehab PT Goals PT Goal Formulation: Patient unable to participate in goal setting Time For Goal Achievement: 11/03/15 Potential to Achieve Goals: Fair Progress towards PT goals: Progressing toward goals  Frequency    BID      PT Plan Current plan remains appropriate    Co-evaluation             End of Session Equipment Utilized During Treatment: Gait belt Activity Tolerance: Patient limited by pain Patient left: in bed;with call bell/phone within reach;with bed alarm set;with nursing/sitter in room     Time: [id number] PT Time Calculation (min) (ACUTE ONLY): 23  min  Charges:  $Therapeutic Activity: 23-37 mins                    G Codes:       Kalii Chesmore H. Manson PasseyBrown, PT, DPT, NCS 10/22/15, 2:03 PM [phone]

## 2015-10-22 NOTE — Progress Notes (Signed)
Sp[facility] with Rosanne AshingJim[facility] at [phone], to notify of non-emergent EMS transport.  Auth notification reference given as [id number].   Service date range good from 10/22/15 - 01/20/16.   Geographical gap exception requested to determine if services can be considered at an in-network level.

## 2015-10-22 NOTE — Progress Notes (Signed)
[employer] - [place] at [facility]   PATIENT NAME: Caitlin Ross    MR#:  [id number]  DATE OF BIRTH:  February 16, 1922  SUBJECTIVE:  CHIEF COMPLAINT:   Chief Complaint  Patient presents with  . Fall   - s/p right hip hemiarthroplasty, POD # 2today - more alert today, pleasantly confused, does not appear to be in distress  REVIEW OF SYSTEMS:  Review of Systems  Unable to perform ROS: Dementia    DRUG ALLERGIES:  No Known Allergies  VITALS:  Blood pressure 125/62, pulse 91, temperature 98.8 F (37.1 C), temperature source Oral, resp. rate 18, height 5\' 6"  (1.676 m), weight 57 kg (125 lb 11.2 oz), SpO2 96 %.  PHYSICAL EXAMINATION:  Physical Exam  GENERAL:  82 y.o.-year-old elderly patient lying in the bed with no acute distress.  EYES: Pupils equal, round, reactive to light and accommodation. No scleral icterus. Normal extraocular movements HEENT: Head atraumatic, normocephalic. Oropharynx and nasopharynx clear.  NECK:  Supple, no jugular venous distention. No thyroid enlargement, no tenderness.  LUNGS: Normal breath sounds bilaterally, no wheezing, rales,rhonchi or crepitation. No use of accessory muscles of respiration. Decreased bibasilar breath sounds CARDIOVASCULAR: S1, S2 normal. No  rubs, or gallops. 2/6 systolic murmur present. ABDOMEN: Soft, nontender, nondistended. Bowel sounds present. No organomegaly or mass.  EXTREMITIES: No pedal edema, cyanosis, or clubbing. Dressing to right hip NEUROLOGIC: following commands, strength both upper extremities 5/5, sensation intact, lower extremities- esp right hip is s/p surgery with limited movement from pain  PSYCHIATRIC: The patient is alert but not oriented.  SKIN: No obvious rash, lesion, or ulcer.    LABORATORY PANEL:   CBC  Recent Labs Lab 10/22/15 0413  WBC 9.2  HGB 8.9*  HCT 25.9*  PLT 150    ------------------------------------------------------------------------------------------------------------------  Chemistries   Recent Labs Lab 10/22/15 0413  NA 142  K 3.7  CL 114*  CO2 24  GLUCOSE 107*  BUN 20  CREATININE 0.71  CALCIUM 8.0*   ------------------------------------------------------------------------------------------------------------------  Cardiac Enzymes No results for input(s): TROPONINI in the last 168 hours. ------------------------------------------------------------------------------------------------------------------  RADIOLOGY:  Dg Hip Port Unilat With Pelvis 1v Right  Result Date: 10/20/2015 CLINICAL DATA:  Postop right total hip replacement EXAM: DG HIP (WITH OR WITHOUT PELVIS) 1V PORT RIGHT COMPARISON:  Pelvis films of 10/19/2015 FINDINGS: The femoral and acetabular components of the right total hip replacement appear to be in good position and alignment. No complicating features are seen. The bones are osteopenic. IMPRESSION: Right total hip replacement components in good position. No complicating features. Electronically Signed   By: Dwyane Dee M.D.   On: 10/20/2015 15:37    EKG:   Orders placed or performed during the hospital encounter of 10/19/15  . EKG 12-Lead  . EKG 12-Lead    ASSESSMENT AND PLAN:   82 year old female with past history of Alzheimer's dementia, hypertension, previous history of UTI who presented to the hospital due to a mechanical fall and noted to have a right hip fracture.  1. Right hip fracture-secondary to mechanical fall. S/p right hemiarthroplasty POD #2 - changed pain medications as oxycodone causing sedation - on tramadol for moderate and severe pain - appreciate ortho consult - pain mgmt, physical therapy consulted -Will need rehab  2. Dementia-monitor for delirium.   3. Anemia- post surgical, acute on chronic. - monitor, add iron tabs, no indication for Tx yet unless hemodynamically unstable,  monitor again in AM  4. DVT Prophylaxis- on lovenox  Physical therapy consulted.  To SNF tomorrow   All the records are reviewed and case discussed with Care Management/Social Workerr. Management plans discussed with the patient, family and they are in agreement.  CODE STATUS: full code  TOTAL TIME TAKING CARE OF THIS PATIENT: 34 minutes.   POSSIBLE D/C TOMORROW, DEPENDING ON CLINICAL CONDITION.   Enid BaasKALISETTI,Eulalio Reamy M.D on 10/22/2015 at 10:06 AM  Between 7am to 6pm - Pager - [phone]  After 6pm go to [email or web address] - Social research officer, governmentpassword EPAS [facility]  [facility]  Office  [phone]  CC: Primary care physician; Lyndon CodeKHAN, FOZIA M, MD

## 2015-10-22 NOTE — Progress Notes (Signed)
SNF and Non-Emergent EMS Transport Benefits:  Number called: [phone] Rep: Vicente Males Reference Number: [id number]  [employer] Medicare Complete HMO Plan One active as of 04/25/15 with no deductible.  Out of pocket max is $4900, of which $269.48 met so far.  In-network SNF: $0 copay for days 1-20, a $160 daily copay for days 21-51, and a $0 copay for days 52-100.  Once out of pocket is reached, patient covered at 100% for remainder of 100 day benefit period. Per rep, all 100 days available at this time.  $0 copay for professional fees and 3 day hospital stay is not required.  Josem Kaufmann is required: [phone].    Non-emergent EMS transport: $250 copay for each one way medically necessary, Medicare covered trip.  Josem Kaufmann is required: [phone].

## 2015-10-22 NOTE — Progress Notes (Signed)
Patient's son Molly MaduroRobert called Visual merchandiserClinical Social Worker (CSW) and asked [facility] [place] CSW assistant called [employer] and ran benefits. CSW explained to son that [facility] In-network SNF: $0 copay for days 1-20, a $160 daily copay for days 21-51, and a $0 copay for days 52-100. Son verbalized his understanding and thanked CSW for assistance. [facility]. CSW will continue to follow and assist as needed.   Baker Hughes IncorporatedBailey Emmilee Reamer, LCSW [phone]

## 2015-10-23 ENCOUNTER — Encounter
Admission: RE | Admit: 2015-10-23 | Discharge: 2015-10-23 | Disposition: A | Discharge status: Home / Self Care | Payer: Medicare Other | Source: Ambulatory Visit | Attending: Internal Medicine | Admitting: Internal Medicine

## 2015-10-23 DIAGNOSIS — D62 Acute posthemorrhagic anemia: Secondary | ICD-10-CM

## 2015-10-23 DIAGNOSIS — D72829 Elevated white blood cell count, unspecified: Secondary | ICD-10-CM

## 2015-10-23 DIAGNOSIS — R739 Hyperglycemia, unspecified: Secondary | ICD-10-CM

## 2015-10-23 DIAGNOSIS — Z96649 Presence of unspecified artificial hip joint: Secondary | ICD-10-CM

## 2015-10-23 DIAGNOSIS — S72001A Fracture of unspecified part of neck of right femur, initial encounter for closed fracture: Secondary | ICD-10-CM

## 2015-10-23 LAB — CBC
HEMATOCRIT: 23.7 % — AB (ref 35.0–47.0)
Hemoglobin: 8 g/dL — ABNORMAL LOW (ref 12.0–16.0)
MCH: 31.1 pg (ref 26.0–34.0)
MCHC: 33.8 g/dL (ref 32.0–36.0)
MCV: 91.9 fL (ref 80.0–100.0)
Platelets: 161 10*3/uL (ref 150–440)
RBC: 2.58 MIL/uL — ABNORMAL LOW (ref 3.80–5.20)
RDW: 12.9 % (ref 11.5–14.5)
WBC: 8.7 10*3/uL (ref 3.6–11.0)

## 2015-10-23 MED ORDER — BISACODYL 10 MG RE SUPP: 10.0000 mg | Freq: Every day | RECTAL | 0 refills | Status: AC | PRN

## 2015-10-23 MED ORDER — FERROUS SULFATE 325 (65 FE) MG PO TABS: 325.0000 mg | ORAL_TABLET | Freq: Three times a day (TID) | ORAL | 3 refills | Status: AC

## 2015-10-23 MED ORDER — GUAIFENESIN-CODEINE 100-10 MG/5ML PO SYRP: 5.0000 mL | ORAL_SOLUTION | Freq: Three times a day (TID) | ORAL | 0 refills | Status: AC | PRN

## 2015-10-23 MED ORDER — ENOXAPARIN SODIUM 30 MG/0.3ML ~~LOC~~ SOLN: 30.0000 mg | SUBCUTANEOUS | 0 refills | Status: AC

## 2015-10-23 MED ORDER — DOCUSATE SODIUM 100 MG PO CAPS: 100.0000 mg | ORAL_CAPSULE | Freq: Two times a day (BID) | ORAL | 0 refills | Status: AC

## 2015-10-23 MED ORDER — ENSURE ENLIVE PO LIQD: 237.0000 mL | Freq: Two times a day (BID) | ORAL | 12 refills | Status: AC

## 2015-10-23 MED ORDER — METHOCARBAMOL 500 MG PO TABS: 500.0000 mg | ORAL_TABLET | Freq: Four times a day (QID) | ORAL | 0 refills | Status: AC | PRN

## 2015-10-23 MED ORDER — ALUM & MAG HYDROXIDE-SIMETH 200-200-20 MG/5ML PO SUSP: 30.0000 mL | ORAL | 0 refills | Status: AC | PRN

## 2015-10-23 MED ORDER — POLYETHYLENE GLYCOL 3350 17 G PO PACK: 17.0000 g | PACK | Freq: Every day | ORAL | 0 refills | Status: AC | PRN

## 2015-10-23 MED ORDER — INFLUENZA VAC SPLIT QUAD 0.5 ML IM SUSY: 0.5000 mL | PREFILLED_SYRINGE | INTRAMUSCULAR | Status: DC

## 2015-10-23 MED ORDER — TRAMADOL HCL 50 MG PO TABS: 50.0000 mg | ORAL_TABLET | Freq: Four times a day (QID) | ORAL | 0 refills | Status: AC | PRN

## 2015-10-23 NOTE — Clinical Social Work Placement (Signed)
   CLINICAL SOCIAL WORK PLACEMENT  NOTE  Date:  10/23/2015  Patient Details  Name: Caitlin Ross MRN: [id number] Date of Birth: [birth year]  Clinical Social Work is seeking post-discharge placement for this patient at the Skilled  Nursing Facility level of care (*CSW will initial, date and re-position this form in  chart as items are completed):  Yes   Patient/family provided with [place] Clinical Social Work Department's list of facilities offering this level of care within the geographic area requested by the patient (or if unable, by the patient's family).  Yes   Patient/family informed of their freedom to choose among providers that offer the needed level of care, that participate in Medicare, Medicaid or managed care program needed by the patient, have an available bed and are willing to accept the patient.  Yes   Patient/family informed of [facility]'s ownership interest in [facility] and [facility], as well as of the fact that they are under no obligation to receive care at these facilities.  PASRR submitted to EDS on 10/20/15     PASRR number received on 10/20/15     Existing PASRR number confirmed on       FL2 transmitted to all facilities in geographic area requested by pt/family on 10/20/15     FL2 transmitted to all facilities within larger geographic area on       Patient informed that his/her managed care company has contracts with or will negotiate with certain facilities, including the following:        Yes   Patient/family informed of bed offers received.  Patient chooses bed at  [facility] )     Physician recommends and patient chooses bed at      Patient to be transferred to  [facility] ) on 10/23/15.  Patient to be transferred to facility by  [facility] )     Patient family notified on 10/23/15 of transfer.  Name of family member notified:   (Patient's son Caitlin MaduroRobert is at bedside and aware of D/C today. )     PHYSICIAN     Additional Comment:    _______________________________________________ Cynda Soule, Darleen CrockerBailey M, LCSW 10/23/2015, 11:44 AM

## 2015-10-23 NOTE — Progress Notes (Signed)
  Subjective:  POD #3 s/p right hip hemi-arthroplasty.  Patient unable to provide an accurate history given dementia, but she appears comfortable. Her son and daughter at the bedside.  Objective:   VITALS:   Vitals:   10/22/15 2133 10/22/15 2350 10/23/15 0401 10/23/15 0756  BP:  (!) 77/58 (!) 123/46 (!) 110/49  Pulse:  82 85 79  Resp:  20 18 16   Temp: 100.3 F (37.9 C) 99.1 F (37.3 C) 98.9 F (37.2 C)   TempSrc:  Oral Oral   SpO2:  94% 97% 94%  Weight:      Height:        PHYSICAL EXAM:  Right lower extremity: Patient's incision is clean dry and intact. Her thigh compartments are soft and compressible. She has spontaneous movement of the right foot and palpable pedal pulses. She is wearing her abduction pillow is up out of bed to a chair. Patient cannot be assessed based on the patient's dementia and inability to follow commands. Patient is wearing TED stockings and foot pumps.   LABS  Results for orders placed or performed during the hospital encounter of 10/19/15 (from the past 24 hour(s))  CBC     Status: Abnormal   Collection Time: 10/23/15  3:36 AM  Result Value Ref Range   WBC 8.7 3.6 - 11.0 K/uL   RBC 2.58 (L) 3.80 - 5.20 MIL/uL   Hemoglobin 8.0 (L) 12.0 - 16.0 g/dL   HCT 16.123.7 (L) 09.635.0 - 04.547.0 %   MCV 91.9 80.0 - 100.0 fL   MCH 31.1 26.0 - 34.0 pg   MCHC 33.8 32.0 - 36.0 g/dL   RDW 40.912.9 81.111.5 - 91.414.5 %   Platelets 161 150 - 440 K/uL    No results found.  Assessment/Plan: 3 Days Post-Op   Principal Problem:   Hip fracture (HCC) Active Problems:   Fracture of femoral neck, right (HCC)   S/P hip hemiarthroplasty   Acute posthemorrhagic anemia   Leukocytosis   Hyperglycemia  Patient is stable postop.   She may be discharged to SNF from an orthopedic standpoint. I will see the patient back in the office for follow-up in 10-14 days for wound check, staple removal and x-ray.  Orthopedic discharge instructions: Patient has had a right hip hemiarthroplasty.  She may be weightbearing as tolerated. Patient needs to observe posterior hip precautions. Patient should continue to elevate the right lower extremity and may apply ice to the surgical site prn.  The patient should continue using incentive spirometry every hour while awake. Patient should also continue TED stockings until their orthopedic follow-up. Patient will remain on Lovenox for 4 weeks for DVT prophylaxis.  Patient should continue physical and occupational therapy at the SNF to work on hip range of motion, lower extremity strengthening and gait training. She should use a walker for assistance with ambulation at all times.  Patient should have their dressing changed as needed.  Patient will follow up with Dr. Juanell FairlyKevin Jasime Westergren at [facility] in 10-14 days for wound check, staple removal and x-ray.    [name], Khylei Wilms , MD 10/23/2015, 10:56 AM

## 2015-10-23 NOTE — Progress Notes (Signed)
Physical Therapy Treatment Patient Details Name: Caitlin Ross MRN: [id number] DOB: February 17, 1922 Today's Date: 10/23/2015    History of Present Illness Pt. is a 82 y.o. female who was admitted to [facility] for hemiarthroplasy repair of a Right Hip Fracture s/p fall.    PT Comments    Continues to require dep assist +2 for all functional mobility; total assist for adherence to THPs R LE.  Poor comprehension of situation, role of rehab; very limited carry-over of education/training between sessions.  Anticipate optimal participation with functional, automatic tasks (sit/stand, transfers, gait) in 'normal' movement patterns as post-op pain resolves.   Follow Up Recommendations  SNF     Equipment Recommendations       Recommendations for Other Services       Precautions / Restrictions Precautions Precautions: Posterior Hip;Fall Restrictions Weight Bearing Restrictions: Yes RLE Weight Bearing: Weight bearing as tolerated    Mobility  Bed Mobility Overal bed mobility: Needs Assistance Bed Mobility: Supine to Sit     Supine to sit: Total assist;+2 for physical assistance     General bed mobility comments: total assist for LE management, trunk control  Transfers Overall transfer level: Needs assistance   Transfers: Lateral/Scoot Transfers Sit to Stand: Total assist;+2 physical assistance         General transfer comment: hand-over-hand for UE placement, forward trunk lean/weight shift and foot placement (for adherence to THPs)  Ambulation/Gait             General Gait Details: unsafe/unable    Stairs            Wheelchair Mobility    Modified Rankin (Stroke Patients Only)       Balance Overall balance assessment: Needs assistance Sitting-balance support: No upper extremity supported;Feet supported Sitting balance-Leahy Scale: Fair Sitting balance - Comments: able to maintain unsupported sitting with close sup   Standing balance support: Bilateral  upper extremity supported                        Cognition Arousal/Alertness: Awake/alert Behavior During Therapy: WFL for tasks assessed/performed Overall Cognitive Status: History of cognitive impairments - at baseline                      Exercises Other Exercises Other Exercises: Supine R LE therex, 1x10, act assist ROM for muscular strength/endurance with functional activities:  ankle pumps, SAQs, heel raises, hip abduct/adduct.  Difficulty following commands and attending to task due to cognitive impairment.  Remains generally guarded due to pain.    General Comments        Pertinent Vitals/Pain Pain Assessment: Faces Faces Pain Scale: Hurts even more Pain Location: R hip Pain Descriptors / Indicators: Grimacing;Aching Pain Intervention(s): Limited activity within patient's tolerance;Monitored during session;Repositioned    Home Living                      Prior Function            PT Goals (current goals can now be found in the care plan section) Acute Rehab PT Goals PT Goal Formulation: Patient unable to participate in goal setting Time For Goal Achievement: 11/03/15 Potential to Achieve Goals: Fair Progress towards PT goals: Progressing toward goals    Frequency    BID      PT Plan Current plan remains appropriate    Co-evaluation             End of  Session Equipment Utilized During Treatment: Gait belt Activity Tolerance: Patient limited by pain Patient left: in chair;with call bell/phone within reach;with chair alarm set;with family/visitor present     Time: [id number] PT Time Calculation (min) (ACUTE ONLY): 18 min  Charges:  $Therapeutic Activity: 8-22 mins                    G Codes:      Gredmarie Delange H. Manson PasseyBrown, PT, DPT, NCS 10/23/15, 9:31 AM [phone]

## 2015-10-23 NOTE — Discharge Summary (Signed)
[facility] - [place] at [facility]   PATIENT NAME: Caitlin Ross    MR#:  [id number]  DATE OF BIRTH:  15-Mar-1922  DATE OF ADMISSION:  10/19/2015 ADMITTING PHYSICIAN: Houston Siren, MD  DATE OF DISCHARGE: No discharge date for patient encounter.  PRIMARY CARE PHYSICIAN: Lyndon Code, MD     ADMISSION DIAGNOSIS:  Hip fracture, right, closed, initial encounter (HCC) [S72.001A]  DISCHARGE DIAGNOSIS:  Principal Problem:   Hip fracture (HCC) Active Problems:   Fracture of femoral neck, right (HCC)   S/P hip hemiarthroplasty   Acute posthemorrhagic anemia   Leukocytosis   Hyperglycemia   SECONDARY DIAGNOSIS:   Past Medical History:  Diagnosis Date  . Alzheimer's disease   . Asthma   . Atrophic vaginitis   . Dementia   . Hypertension   . Urinary tract infection     .pro HOSPITAL COURSE:  The patient is an 82 year old Caucasian female with medical history significant for history of Alzheimer's dementia, asthma, essential hypertension, who presents to the hospital with complaints of right hip pain after at home. Apparently patient was walking up the stairs when suddenly she lost her footing and fell on the right side. In the hospital, patient was noted to have femoral neck fracture. She was seen by orthopedist surgeon and underwent right hip hemiarthroplasty 10/20/2015. Postoperatively, she was noted to be mildly anemic, with hemoglobin level dropping down to 8.0 from 11.8. No ongoing bleeding was noted and hemoglobin level remained stable over the past 48 hours. It was felt that patient is stable to be discharged to skilled nursing facility, per physical therapist's recommendations. Patient's labs otherwise revealed leukocytosis on arrival to the hospital, hyperglycemia, leukocytosis, improved with conservative therapy, hemoglobin A1c level is pending. Discussion by problem: #1 Femoral neck fracture status post right hip hemiarthroplasty, patient is to  continue prophylactic therapy with Lovenox for the next 14 days, continue pain medications, physical therapy, patient is being discharged to skilled nursing facility for rehabilitation, per physical therapist's recommendations. #2. Acute posthemorrhagic anemia, hemoglobin level dropped down from 11.8 on arrival to 8.0 on the day of discharge, no transfusion was performed, patient is to continue iron supplementation, follow hemoglobin level closely #3. Hyperglycemia, getting hemoglobin A1c to rule out diabetes mellitus #4. Leukocytosis, resolved with conservative therapy, getting urine analysis to rule out urinary tract infection, UA is pending #5. Dementia, supportive therapy, aspiration precautions, fall precautions   DISCHARGE CONDITIONS:   Stable  CONSULTS OBTAINED:    DRUG ALLERGIES:  No Known Allergies  DISCHARGE MEDICATIONS:   Current Discharge Medication List    START taking these medications   Details  alum & mag hydroxide-simeth (MAALOX/MYLANTA) 200-200-20 MG/5ML suspension Take 30 mLs by mouth every 4 (four) hours as needed for indigestion. Qty: 355 mL, Refills: 0    bisacodyl (DULCOLAX) 10 MG suppository Place 1 suppository (10 mg total) rectally daily as needed for moderate constipation. Qty: 12 suppository, Refills: 0    docusate sodium (COLACE) 100 MG capsule Take 1 capsule (100 mg total) by mouth 2 (two) times daily. Qty: 10 capsule, Refills: 0    enoxaparin (LOVENOX) 30 MG/0.3ML injection Inject 0.3 mLs (30 mg total) into the skin daily. Qty: 14 Syringe, Refills: 0    feeding supplement, ENSURE ENLIVE, (ENSURE ENLIVE) LIQD Take 237 mLs by mouth 2 (two) times daily between meals. Qty: 237 mL, Refills: 12    ferrous sulfate 325 (65 FE) MG tablet Take 1 tablet (325 mg total) by mouth  3 (three) times daily with meals. Qty: 90 tablet, Refills: 3    methocarbamol (ROBAXIN) 500 MG tablet Take 1 tablet (500 mg total) by mouth every 6 (six) hours as needed for muscle  spasms. Qty: 60 tablet, Refills: 0    polyethylene glycol (MIRALAX / GLYCOLAX) packet Take 17 g by mouth daily as needed for mild constipation. Qty: 14 each, Refills: 0    traMADol (ULTRAM) 50 MG tablet Take 1 tablet (50 mg total) by mouth every 6 (six) hours as needed for moderate pain or severe pain. Qty: 40 tablet, Refills: 0      CONTINUE these medications which have CHANGED   Details  guaiFENesin-codeine (ROBITUSSIN AC) 100-10 MG/5ML syrup Take 5 mLs by mouth 3 (three) times daily as needed for cough. Reported on 04/29/2015 Qty: 120 mL, Refills: 0      CONTINUE these medications which have NOT CHANGED   Details  Ascorbic Acid (C-500) 500 MG CHEW Chew by mouth.    aspirin 81 MG chewable tablet Chew 81 mg by mouth daily.    Cranberry 500 MG CAPS Take by mouth.    ondansetron (ZOFRAN ODT) 4 MG disintegrating tablet Take 1 tablet (4 mg total) by mouth every 8 (eight) hours as needed for nausea or vomiting. Qty: 20 tablet, Refills: 0         DISCHARGE INSTRUCTIONS:    Patient is to follow-up with primary care physician, orthopedist surgeon within 1-2 weeks after discharge  If you experience worsening of your admission symptoms, develop shortness of breath, life threatening emergency, suicidal or homicidal thoughts you must seek medical attention immediately by calling 911 or calling your MD immediately  if symptoms less severe.  You Must read complete instructions/literature along with all the possible adverse reactions/side effects for all the Medicines you take and that have been prescribed to you. Take any new Medicines after you have completely understood and accept all the possible adverse reactions/side effects.   Please note  You were cared for by a hospitalist during your hospital stay. If you have any questions about your discharge medications or the care you received while you were in the hospital after you are discharged, you can call the unit and asked to speak  with the hospitalist on call if the hospitalist that took care of you is not available. Once you are discharged, your primary care physician will handle any further medical issues. Please note that NO REFILLS for any discharge medications will be authorized once you are discharged, as it is imperative that you return to your primary care physician (or establish a relationship with a primary care physician if you do not have one) for your aftercare needs so that they can reassess your need for medications and monitor your lab values.    Today   CHIEF COMPLAINT:   Chief Complaint  Patient presents with  . Fall    HISTORY OF PRESENT ILLNESS:  Alexandera Kuntzman  is a 82 y.o. female with a known history of Alzheimer's dementia, asthma, essential hypertension, who presents to the hospital with complaints of right hip pain after at home. Apparently patient was walking up the stairs when suddenly she lost her footing and fell on the right side. In the hospital, patient was noted to have femoral neck fracture. She was seen by orthopedist surgeon and underwent right hip hemiarthroplasty 10/20/2015. Postoperatively, she was noted to be mildly anemic, with hemoglobin level dropping down to 8.0 from 11.8. No ongoing bleeding was noted and  hemoglobin level remained stable over the past 48 hours. It was felt that patient is stable to be discharged to skilled nursing facility, per physical therapist's recommendations. Patient's labs otherwise revealed leukocytosis on arrival to the hospital, hyperglycemia, leukocytosis, improved with conservative therapy, hemoglobin A1c level is pending. Discussion by problem: #1 Femoral neck fracture status post right hip hemiarthroplasty, patient is to continue prophylactic therapy with Lovenox for the next 14 days, continue pain medications, physical therapy, patient is being discharged to skilled nursing facility for rehabilitation, per physical therapist's recommendations. #2.  Acute posthemorrhagic anemia, hemoglobin level dropped down from 11.8 on arrival to 8.0 on the day of discharge, no transfusion was performed, patient is to continue iron supplementation, follow hemoglobin level closely #3. Hyperglycemia, getting hemoglobin A1c to rule out diabetes mellitus #4. Leukocytosis, resolved with conservative therapy, getting urine analysis to rule out urinary tract infection, UA is pending #5. Dementia, supportive therapy, aspiration precautions, fall precautions    VITAL SIGNS:  Blood pressure (!) 110/49, pulse 79, temperature 98.9 F (37.2 C), temperature source Oral, resp. rate 16, height 5\' 6"  (1.676 m), weight 57 kg (125 lb 11.2 oz), SpO2 94 %.  I/O:   Intake/Output Summary (Last 24 hours) at 10/23/15 0902 Last data filed at 10/23/15 0354  Gross per 24 hour  Intake             2245 ml  Output                0 ml  Net             2245 ml    PHYSICAL EXAMINATION:  GENERAL:  82 y.o.-year-old patient lying in the bed with no acute distress.  EYES: Pupils equal, round, reactive to light and accommodation. No scleral icterus. Extraocular muscles intact.  HEENT: Head atraumatic, normocephalic. Oropharynx and nasopharynx clear.  NECK:  Supple, no jugular venous distention. No thyroid enlargement, no tenderness.  LUNGS: Normal breath sounds bilaterally, no wheezing, rales,rhonchi or crepitation. No use of accessory muscles of respiration.  CARDIOVASCULAR: S1, S2 normal. No murmurs, rubs, or gallops.  ABDOMEN: Soft, non-tender, non-distended. Bowel sounds present. No organomegaly or mass.  EXTREMITIES: No pedal edema, cyanosis, or clubbing.  NEUROLOGIC: Cranial nerves II through XII are intact. Muscle strength 5/5 in all extremities. Sensation intact. Gait not checked.  PSYCHIATRIC: The patient is alert and oriented x 3.  SKIN: No obvious rash, lesion, or ulcer.   DATA REVIEW:   CBC  Recent Labs Lab 10/23/15 0336  WBC 8.7  HGB 8.0*  HCT 23.7*  PLT 161     Chemistries   Recent Labs Lab 10/22/15 0413  NA 142  K 3.7  CL 114*  CO2 24  GLUCOSE 107*  BUN 20  CREATININE 0.71  CALCIUM 8.0*    Cardiac Enzymes No results for input(s): TROPONINI in the last 168 hours.  Microbiology Results  Results for orders placed or performed in visit on 04/29/15  Microscopic Examination     Status: Abnormal   Collection Time: 04/29/15  3:25 PM  Result Value Ref Range Status   WBC, UA >30 (A) 0 - 5 /hpf Final   RBC, UA None seen 0 - 2 /hpf Final   Epithelial Cells (non renal) 0-10 0 - 10 /hpf Final   Renal Epithel, UA 0-10 (A) None seen /hpf Final   Bacteria, UA Few None seen/Few Final    RADIOLOGY:  No results found.  EKG:   Orders placed or performed during  the hospital encounter of 10/19/15  . EKG 12-Lead  . EKG 12-Lead      Management plans discussed with the patient, family and they are in agreement.  CODE STATUS:  Code Status History    Date Active Date Inactive Code Status Order ID Comments User Context   10/19/2015  9:56 PM 10/20/2015  4:00 PM Full Code [id number]  Houston SirenVivek J Sainani, MD Inpatient   10/19/2015  7:50 PM 10/19/2015  7:50 PM Full Code [id number]  Juanell FairlyKevin Krasinski, MD ED    Advance Directive Documentation   Flowsheet Row Most Recent Value  Type of Advance Directive  Living will  Pre-existing out of facility DNR order (yellow form or pink MOST form)  No data  "MOST" Form in Place?  No data      TOTAL TIME TAKING CARE OF THIS PATIENT: 40 minutes.  Discussed with patient's daughter  Katharina CaperVAICKUTE,Jozalynn Noyce M.D on 10/23/2015 at 9:02 AM  Between 7am to 6pm - Pager - [phone]  After 6pm go to [email or web address] - password [email or web address] [facility]  [facility]  Office  [phone]  CC: Primary care physician; Lyndon CodeKHAN, FOZIA M, MD

## 2015-10-23 NOTE — Progress Notes (Signed)
Patient is medically stable for D/C to [facility] today. Per Kim admissions coordinator at [facility] will go to room 204-A. RN will call report at [phone] and arrange EMS for transport. Clinical Child psychotherapistocial Worker (CSW) sent D/C orders to [employer] via [employer]. Patient's son Molly MaduroRobert was at bedside and aware of above. Son is agreeable to go over to [facility] today to complete admissions paper work. Please reconsult if future social work needs arise. CSW signing off.   [employer] Hughes IncorporatedBailey Lesly Pontarelli, LCSW [phone]

## 2015-10-23 NOTE — Progress Notes (Signed)
[facility] being [facility] 204[facility]. Report called to [place]. VS stable at time of d/c. Belongings packed. EMS called.

## 2015-10-23 NOTE — Care Management Important Message (Signed)
Important Message  Patient Details  Name: Dianna RossettiMalinda L Dungan MRN: [id number] Date of Birth: December 14, 1922   Medicare Important Message Given:  Yes    Collie Siadngela Javonte Elenes, RN 10/23/2015, 11:31 AM

## 2015-10-25 ENCOUNTER — Encounter
Admission: RE | Admit: 2015-10-25 | Discharge: 2015-10-25 | Disposition: A | Discharge status: Home / Self Care | Payer: No Typology Code available for payment source | Source: Ambulatory Visit | Attending: Internal Medicine | Admitting: Internal Medicine

## 2015-10-25 DIAGNOSIS — D62 Acute posthemorrhagic anemia: Secondary | ICD-10-CM | POA: Insufficient documentation

## 2015-10-25 DIAGNOSIS — X58XXXD Exposure to other specified factors, subsequent encounter: Secondary | ICD-10-CM | POA: Insufficient documentation

## 2015-10-25 DIAGNOSIS — S72001D Fracture of unspecified part of neck of right femur, subsequent encounter for closed fracture with routine healing: Secondary | ICD-10-CM | POA: Insufficient documentation

## 2015-10-25 LAB — HEMOGLOBIN A1C
HEMOGLOBIN A1C: 5.9 % — AB (ref 4.8–5.6)
MEAN PLASMA GLUCOSE: 123 mg/dL

## 2015-10-27 ENCOUNTER — Non-Acute Institutional Stay (SKILLED_NURSING_FACILITY): Payer: Medicare Other | Admitting: Gerontology

## 2015-10-27 DIAGNOSIS — D62 Acute posthemorrhagic anemia: Secondary | ICD-10-CM

## 2015-10-27 DIAGNOSIS — Z96649 Presence of unspecified artificial hip joint: Secondary | ICD-10-CM | POA: Diagnosis not present

## 2015-10-27 LAB — CBC WITH DIFFERENTIAL/PLATELET
Basophils Absolute: 0 10*3/uL (ref 0–0.1)
Basophils Relative: 0 %
EOS ABS: 0.2 10*3/uL (ref 0–0.7)
Eosinophils Relative: 2 %
HEMATOCRIT: 22.3 % — AB (ref 35.0–47.0)
HEMOGLOBIN: 7.7 g/dL — AB (ref 12.0–16.0)
LYMPHS ABS: 1.3 10*3/uL (ref 1.0–3.6)
Lymphocytes Relative: 19 %
MCH: 31.5 pg (ref 26.0–34.0)
MCHC: 34.6 g/dL (ref 32.0–36.0)
MCV: 91.1 fL (ref 80.0–100.0)
Monocytes Absolute: 0.6 10*3/uL (ref 0.2–0.9)
Monocytes Relative: 9 %
NEUTROS ABS: 4.8 10*3/uL (ref 1.4–6.5)
NEUTROS PCT: 70 %
Platelets: 289 10*3/uL (ref 150–440)
RBC: 2.45 MIL/uL — AB (ref 3.80–5.20)
RDW: 13.3 % (ref 11.5–14.5)
WBC: 6.9 10*3/uL (ref 3.6–11.0)

## 2015-10-27 LAB — COMPREHENSIVE METABOLIC PANEL
ALBUMIN: 2.1 g/dL — AB (ref 3.5–5.0)
ALT: 25 U/L (ref 14–54)
ANION GAP: 5 (ref 5–15)
AST: 34 U/L (ref 15–41)
Alkaline Phosphatase: 65 U/L (ref 38–126)
BUN: 20 mg/dL (ref 6–20)
CHLORIDE: 105 mmol/L (ref 101–111)
CO2: 29 mmol/L (ref 22–32)
Calcium: 7.6 mg/dL — ABNORMAL LOW (ref 8.9–10.3)
Creatinine, Ser: 0.57 mg/dL (ref 0.44–1.00)
GFR calc non Af Amer: >60 mL/min (ref >60)
Glucose, Bld: 97 mg/dL (ref 65–99)
POTASSIUM: 3.4 mmol/L — AB (ref 3.5–5.1)
SODIUM: 139 mmol/L (ref 135–145)
Total Bilirubin: 0.4 mg/dL (ref 0.3–1.2)
Total Protein: 5.1 g/dL — ABNORMAL LOW (ref 6.5–8.1)

## 2015-10-28 NOTE — Progress Notes (Signed)
Location:      Place of Service:  SNF (31) Provider:  Toni Arthurs, NP-C  Lavera Guise, MD  Patient Care Team: Lavera Guise, MD as PCP - General (Internal Medicine)  Extended Emergency Contact Information Primary Emergency Contact: Jill Poling Address: [address]          [place], [place] [address] Johnnette Litter of [place] Phone: [phone] Mobile Phone: [phone] Relation: Son Secondary Emergency Contact: Karn Pickler [place] of [place] Mobile Phone: [phone] Relation: Daughter  Code Status:  full Goals of care: Advanced Directive information Advanced Directives 10/19/2015  Does patient have an advance directive? Yes  Type of Advance Directive Living will  Does patient want to make changes to advanced directive? No - Patient declined  Copy of advanced directive(s) in chart? No - copy requested     Chief Complaint  Patient presents with  . Hospitalization Follow-up    HPI:  Pt is a 82 y.o. female seen today for a hospital f/u s/p admission from [facility] for Right femur fracture with surgical intervention. Pt is at the facility for rehab. She is severely demented, minimally verbal. Alert but confused. Sitting at the nurses station, watching TV. She is unaware of the femur fracture. Denies pain. Denies SOB. Unable to obtain further ROS.   Past Medical History:  Diagnosis Date  . Alzheimer's disease   . Asthma   . Atrophic vaginitis   . Dementia   . Hypertension   . Urinary tract infection    Past Surgical History:  Procedure Laterality Date  . HIP ARTHROPLASTY Right 10/20/2015   Procedure: ARTHROPLASTY BIPOLAR HIP (HEMIARTHROPLASTY);  Surgeon: Thornton Park, MD;  Location: [facility] ORS;  Service: Orthopedics;  Laterality: Right;  . No surgical Hx      No Known Allergies    Medication List       Accurate as of 10/27/15 11:59 PM. Always use your most recent med list.          alum & mag hydroxide-simeth 200-200-20 MG/5ML  suspension Commonly known as:  MAALOX/MYLANTA Take 30 mLs by mouth every 4 (four) hours as needed for indigestion.   aspirin 81 MG chewable tablet Chew 81 mg by mouth daily.   bisacodyl 10 MG suppository Commonly known as:  DULCOLAX Place 1 suppository (10 mg total) rectally daily as needed for moderate constipation.   C-500 500 MG Chew Generic drug:  Ascorbic Acid Chew by mouth.   Cranberry 500 MG Caps Take by mouth.   docusate sodium 100 MG capsule Commonly known as:  COLACE Take 1 capsule (100 mg total) by mouth 2 (two) times daily.   enoxaparin 30 MG/0.3ML injection Commonly known as:  LOVENOX Inject 0.3 mLs (30 mg total) into the skin daily.   feeding supplement (ENSURE ENLIVE) Liqd Take 237 mLs by mouth 2 (two) times daily between meals.   ferrous sulfate 325 (65 FE) MG tablet Take 1 tablet (325 mg total) by mouth 3 (three) times daily with meals.   guaiFENesin-codeine 100-10 MG/5ML syrup Commonly known as:  ROBITUSSIN AC Take 5 mLs by mouth 3 (three) times daily as needed for cough. Reported on 04/29/2015   methocarbamol 500 MG tablet Commonly known as:  ROBAXIN Take 1 tablet (500 mg total) by mouth every 6 (six) hours as needed for muscle spasms.   ondansetron 4 MG disintegrating tablet Commonly known as:  ZOFRAN ODT Take 1 tablet (4 mg total) by mouth every 8 (eight) hours as needed for nausea or  vomiting.   polyethylene glycol packet Commonly known as:  MIRALAX / GLYCOLAX Take 17 g by mouth daily as needed for mild constipation.   traMADol 50 MG tablet Commonly known as:  ULTRAM Take 1 tablet (50 mg total) by mouth every 6 (six) hours as needed for moderate pain or severe pain.       Review of Systems  Unable to perform ROS: Dementia  Constitutional: Negative for chills, diaphoresis, fatigue and fever.  Respiratory: Negative for cough, chest tightness and shortness of breath.   Cardiovascular: Negative for chest pain and leg swelling.   Musculoskeletal: Positive for gait problem.  Skin: Positive for pallor and wound.  Neurological: Positive for weakness. Negative for dizziness.  Psychiatric/Behavioral: Positive for confusion. Negative for agitation and behavioral problems.    There is no immunization history for the selected administration types on file for this patient. Pertinent  Health Maintenance Due  Topic Date Due  . DEXA SCAN  08/22/1989  . PNA vac Low Risk Adult (1 of 2 - PCV13) 08/22/1989  . INFLUENZA VACCINE  08/25/2015   No flowsheet data found. Functional Status Survey:    Vitals:   10/27/15 0600  BP: 128/65  Pulse: 69  Resp: 16  Temp: 97.7 F (36.5 C)  SpO2: 95%   There is no height or weight on file to calculate BMI. Physical Exam  Constitutional: Vital signs are normal. She appears well-developed and well-nourished. She is active and cooperative. She does not appear ill. No distress.  HENT:  Head: Normocephalic and atraumatic.  Mouth/Throat: Uvula is midline and oropharynx is clear and moist. Mucous membranes are pale, not dry and not cyanotic.  Eyes: Conjunctivae, EOM and lids are normal. Pupils are equal, round, and reactive to light.  Neck: Trachea normal, normal range of motion and full passive range of motion without pain. Neck supple. No JVD present. No tracheal deviation, no edema and no erythema present. No thyromegaly present.  Cardiovascular: Normal rate, regular rhythm, normal heart sounds and intact distal pulses.  Exam reveals no gallop, no distant heart sounds and no friction rub.   No murmur heard. Pulses:      Dorsalis pedis pulses are 1+ on the right side, and 1+ on the left side.  Pulmonary/Chest: Effort normal and breath sounds normal. No accessory muscle usage. No respiratory distress. She has no decreased breath sounds. She has no wheezes. She has no rhonchi. She has no rales. She exhibits no tenderness.  Abdominal: Soft. Normal appearance and bowel sounds are normal. She  exhibits no distension and no ascites. There is no tenderness.  Musculoskeletal: She exhibits no edema or tenderness.       Right hip: She exhibits decreased range of motion, decreased strength and laceration.  Expected osteoarthritis, stiffness  Neurological: She is alert. She has normal strength. She is disoriented.  Skin: Skin is warm and dry. Laceration (Right hip incision) noted. No rash noted. She is not diaphoretic. No cyanosis or erythema. There is pallor. Nails show no clubbing.  Psychiatric: She has a normal mood and affect. Her speech is normal and behavior is normal. Judgment and thought content normal. Cognition and memory are impaired. She exhibits abnormal recent memory and abnormal remote memory.  Nursing note and vitals reviewed.   Labs reviewed:  Recent Labs  10/21/15 0408 10/22/15 0413 10/27/15 0837  NA 141 142 139  K 3.8 3.7 3.4*  CL 112* 114* 105  CO2 23 24 29   GLUCOSE 130* 107* 97  BUN  18 20 20   CREATININE 0.75 0.71 0.57  CALCIUM 7.8* 8.0* 7.6*    Recent Labs  10/27/15 0837  AST 34  ALT 25  ALKPHOS 65  BILITOT 0.4  PROT 5.1*  ALBUMIN 2.1*    Recent Labs  10/19/15 1801  10/22/15 0413 10/23/15 0336 10/27/15 0837  WBC 16.4*  < > 9.2 8.7 6.9  NEUTROABS 12.5*  --   --   --  4.8  HGB 12.8  < > 8.9* 8.0* 7.7*  HCT 37.4  < > 25.9* 23.7* 22.3*  MCV 91.0  < > 91.9 91.9 91.1  PLT 291  < > 150 161 289  < > = values in this interval not displayed. No results found for: TSH Lab Results  Component Value Date   HGBA1C 5.9 (H) 10/23/2015   No results found for: CHOL, HDL, LDLCALC, LDLDIRECT, TRIG, CHOLHDL  Significant Diagnostic Results in last 30 days:  No results found.  Assessment/Plan 1. S/P hip hemiarthroplasty  Continue tylenol prn for pain  Continue tramadol prn for pain  Continue Methocarbamol 500 mg po Q 6 hrs prn for spasms, cramps  Continue PT/OT   Nursing to speak with family about Palliative Care consult  2. Acute  posthemorrhagic anemia  Continue Ferrous Sulfate 325 mg po TID  Vitamin C 500 mg daily  Check CBC with ferritin and TIBC, met B   Family/ staff Communication:   Total Time:  Documentation:  Face to Face:  Family/Phone:   Labs/tests ordered:  Cbc, Brillion, NP-C [facility] [address], [place] [address] Cell Phone (Mon-Fri 8am-5pm):  [phone] On Call:  [phone] & follow prompts after 5pm & weekends Office Phone:  [phone] Office Fax:  [phone]

## 2015-10-29 LAB — CBC WITH DIFFERENTIAL/PLATELET
Basophils Absolute: 0 10*3/uL (ref 0–0.1)
Basophils Relative: 0 %
Eosinophils Absolute: 0.3 10*3/uL (ref 0–0.7)
Eosinophils Relative: 5 %
HEMATOCRIT: 25.1 % — AB (ref 35.0–47.0)
Hemoglobin: 8.5 g/dL — ABNORMAL LOW (ref 12.0–16.0)
LYMPHS ABS: 2.1 10*3/uL (ref 1.0–3.6)
LYMPHS PCT: 30 %
MCH: 30.9 pg (ref 26.0–34.0)
MCHC: 33.9 g/dL (ref 32.0–36.0)
MCV: 91.4 fL (ref 80.0–100.0)
MONO ABS: 0.5 10*3/uL (ref 0.2–0.9)
MONOS PCT: 8 %
NEUTROS ABS: 4 10*3/uL (ref 1.4–6.5)
Neutrophils Relative %: 57 %
Platelets: 433 10*3/uL (ref 150–440)
RBC: 2.74 MIL/uL — ABNORMAL LOW (ref 3.80–5.20)
RDW: 13.5 % (ref 11.5–14.5)
WBC: 7 10*3/uL (ref 3.6–11.0)

## 2015-10-29 LAB — BASIC METABOLIC PANEL
ANION GAP: 7 (ref 5–15)
BUN: 17 mg/dL (ref 6–20)
CHLORIDE: 105 mmol/L (ref 101–111)
CO2: 27 mmol/L (ref 22–32)
Calcium: 7.9 mg/dL — ABNORMAL LOW (ref 8.9–10.3)
Creatinine, Ser: 0.55 mg/dL (ref 0.44–1.00)
GFR calc Af Amer: >60 mL/min (ref >60)
GFR calc non Af Amer: >60 mL/min (ref >60)
GLUCOSE: 85 mg/dL (ref 65–99)
POTASSIUM: 3.2 mmol/L — AB (ref 3.5–5.1)
Sodium: 139 mmol/L (ref 135–145)

## 2015-10-29 LAB — IRON AND TIBC
Iron: 27 ug/dL — ABNORMAL LOW (ref 28–170)
Saturation Ratios: 17 % (ref 10.4–31.8)
TIBC: 161 ug/dL — ABNORMAL LOW (ref 250–450)
UIBC: 134 ug/dL

## 2015-10-29 LAB — FERRITIN: Ferritin: 157 ng/mL (ref 11–307)

## 2015-11-20 ENCOUNTER — Non-Acute Institutional Stay (SKILLED_NURSING_FACILITY): Payer: Medicare Other | Admitting: Gerontology

## 2015-11-20 DIAGNOSIS — Z96649 Presence of unspecified artificial hip joint: Secondary | ICD-10-CM | POA: Diagnosis not present

## 2015-11-20 DIAGNOSIS — D62 Acute posthemorrhagic anemia: Secondary | ICD-10-CM

## 2015-11-20 NOTE — Progress Notes (Signed)
Location:      Place of Service:  SNF (31) Provider:  Toni Arthurs, NP-C  Lavera Guise, MD  Patient Care Team: Lavera Guise, MD as PCP - General (Internal Medicine)  Extended Emergency Contact Information Primary Emergency Contact: Jill Poling Address: [address]          [place], [place] [address] Johnnette Litter of [place] Phone: [phone] Mobile Phone: [phone] Relation: Son Secondary Emergency Contact: Karn Pickler [place] of [place] Mobile Phone: [phone] Relation: Daughter  Code Status:  full Goals of care: Advanced Directive information Advanced Directives 10/19/2015  Does patient have an advance directive? Yes  Type of Advance Directive Living will  Does patient want to make changes to advanced directive? No - Patient declined  Copy of advanced directive(s) in chart? No - copy requested     Chief Complaint  Patient presents with  . Discharge Note    HPI:  Pt is a 82 y.o. female seen today for discharge evaluation s/p admission to rehab from [facility] for Right femur fracture with surgical intervention. Pt was at the facility for rehab, but then transferred to long term care. She is severely demented, minimally verbal. Alert but confused. Sitting at the nurses station, watching TV. She is unaware of the femur fracture. Denies pain. Denies SOB. Unable to obtain further ROS. Pt is being discharged and transferred to [facility].  Past Medical History:  Diagnosis Date  . Alzheimer's disease   . Asthma   . Atrophic vaginitis   . Dementia   . Hypertension   . Urinary tract infection    Past Surgical History:  Procedure Laterality Date  . HIP ARTHROPLASTY Right 10/20/2015   Procedure: ARTHROPLASTY BIPOLAR HIP (HEMIARTHROPLASTY);  Surgeon: Thornton Park, MD;  Location: [facility] ORS;  Service: Orthopedics;  Laterality: Right;  . No surgical Hx      No Known Allergies    Medication List       Accurate as of 11/20/15 10:18 PM. Always use your  most recent med list.          alum & mag hydroxide-simeth 200-200-20 MG/5ML suspension Commonly known as:  MAALOX/MYLANTA Take 30 mLs by mouth every 4 (four) hours as needed for indigestion.   aspirin 81 MG chewable tablet Chew 81 mg by mouth daily.   bisacodyl 10 MG suppository Commonly known as:  DULCOLAX Place 1 suppository (10 mg total) rectally daily as needed for moderate constipation.   C-500 500 MG Chew Generic drug:  Ascorbic Acid Chew by mouth.   Cranberry 500 MG Caps Take by mouth.   docusate sodium 100 MG capsule Commonly known as:  COLACE Take 1 capsule (100 mg total) by mouth 2 (two) times daily.   enoxaparin 30 MG/0.3ML injection Commonly known as:  LOVENOX Inject 0.3 mLs (30 mg total) into the skin daily.   feeding supplement (ENSURE ENLIVE) Liqd Take 237 mLs by mouth 2 (two) times daily between meals.   ferrous sulfate 325 (65 FE) MG tablet Take 1 tablet (325 mg total) by mouth 3 (three) times daily with meals.   guaiFENesin-codeine 100-10 MG/5ML syrup Commonly known as:  ROBITUSSIN AC Take 5 mLs by mouth 3 (three) times daily as needed for cough. Reported on 04/29/2015   methocarbamol 500 MG tablet Commonly known as:  ROBAXIN Take 1 tablet (500 mg total) by mouth every 6 (six) hours as needed for muscle spasms.   ondansetron 4 MG disintegrating tablet Commonly known as:  ZOFRAN ODT Take 1  tablet (4 mg total) by mouth every 8 (eight) hours as needed for nausea or vomiting.   polyethylene glycol packet Commonly known as:  MIRALAX / GLYCOLAX Take 17 g by mouth daily as needed for mild constipation.   traMADol 50 MG tablet Commonly known as:  ULTRAM Take 1 tablet (50 mg total) by mouth every 6 (six) hours as needed for moderate pain or severe pain.       Review of Systems  Unable to perform ROS: Dementia  Constitutional: Negative for chills, diaphoresis, fatigue and fever.  Respiratory: Negative for cough, chest tightness and shortness of  breath.   Cardiovascular: Negative for chest pain and leg swelling.  Musculoskeletal: Positive for gait problem.  Skin: Positive for pallor and wound.  Neurological: Positive for weakness. Negative for dizziness.  Psychiatric/Behavioral: Positive for confusion. Negative for agitation and behavioral problems.    There is no immunization history for the selected administration types on file for this patient. Pertinent  Health Maintenance Due  Topic Date Due  . DEXA SCAN  08/22/1989  . PNA vac Low Risk Adult (1 of 2 - PCV13) 08/22/1989  . INFLUENZA VACCINE  08/25/2015   No flowsheet data found. Functional Status Survey:    Vitals:   11/17/15 2214  BP: 106/60  Pulse: (!) 104  Resp: (!) 22  Temp: 97.8 F (36.6 C)  SpO2: 98%   There is no height or weight on file to calculate BMI. Physical Exam  Constitutional: Vital signs are normal. She appears well-developed and well-nourished. She is active and cooperative. She does not appear ill. No distress.  HENT:  Head: Normocephalic and atraumatic.  Mouth/Throat: Uvula is midline and oropharynx is clear and moist. Mucous membranes are pale, not dry and not cyanotic.  Eyes: Conjunctivae, EOM and lids are normal. Pupils are equal, round, and reactive to light.  Neck: Trachea normal, normal range of motion and full passive range of motion without pain. Neck supple. No JVD present. No tracheal deviation, no edema and no erythema present. No thyromegaly present.  Cardiovascular: Normal rate, regular rhythm, normal heart sounds and intact distal pulses.  Exam reveals no gallop, no distant heart sounds and no friction rub.   No murmur heard. Pulses:      Dorsalis pedis pulses are 1+ on the right side, and 1+ on the left side.  Pulmonary/Chest: Effort normal and breath sounds normal. No accessory muscle usage. No respiratory distress. She has no decreased breath sounds. She has no wheezes. She has no rhonchi. She has no rales. She exhibits no  tenderness.  Abdominal: Soft. Normal appearance and bowel sounds are normal. She exhibits no distension and no ascites. There is no tenderness.  Musculoskeletal: She exhibits no edema or tenderness.       Right hip: She exhibits decreased range of motion, decreased strength and laceration.  Expected osteoarthritis, stiffness  Neurological: She is alert. She has normal strength. She is disoriented.  Skin: Skin is warm and dry. Laceration (Right hip incision) noted. No rash noted. She is not diaphoretic. No cyanosis or erythema. There is pallor. Nails show no clubbing.  Psychiatric: She has a normal mood and affect. Her speech is normal and behavior is normal. Judgment and thought content normal. Cognition and memory are impaired. She exhibits abnormal recent memory and abnormal remote memory.  Nursing note and vitals reviewed.   Labs reviewed:  Recent Labs  10/22/15 0413 10/27/15 0837 10/29/15 0530  NA 142 139 139  K 3.7 3.4* 3.2*  CL 114* 105 105  CO2 _0 GLUCOSE 107* 97 85  BUN _1 CREATININE 0.71 0.57 0.55  CALCIUM 8.0* 7.6* 7.9*    Recent Labs  10/27/15 0837  AST 34  ALT 25  ALKPHOS 65  BILITOT 0.4  PROT 5.1*  ALBUMIN 2.1*    Recent Labs  10/19/15 1801  10/23/15 0336 10/27/15 0837 10/29/15 0530  WBC 16.4*  < > 8.7 6.9 7.0  NEUTROABS 12.5*  --   --  4.8 4.0  HGB 12.8  < > 8.0* 7.7* 8.5*  HCT 37.4  < > 23.7* 22.3* 25.1*  MCV 91.0  < > 91.9 91.1 91.4  PLT 291  < > 161 289 433  < > = values in this interval not displayed. No results found for: TSH Lab Results  Component Value Date   HGBA1C 5.9 (H) 10/23/2015   No results found for: CHOL, HDL, LDLCALC, LDLDIRECT, TRIG, CHOLHDL  Significant Diagnostic Results in last 30 days:  No results found.  Assessment/Plan 1. S/P hip hemiarthroplasty  Continue tylenol prn for pain  Continue tramadol prn for pain  Continue Methocarbamol 500 mg po Q 6 hrs prn for spasms, cramps  Continue PT/OT      2. Acute posthemorrhagic anemia  Continue Ferrous Sulfate 325 mg po TID  Vitamin C 500 mg daily  Check CBC with ferritin and TIBC, met B per PCP  Patient is being discharged with the following home health services:  none  Patient is being discharged with the following durable medical equipment:  none  Patient has been advised to f/u with their PCP in 1-2 weeks to bring them up to date on their rehab stay.  Social services at facility was responsible for arranging this appointment.  Pt was provided with a 30 day supply of prescriptions for medications and refills must be obtained from their PCP.  For controlled substances, a more limited supply may be provided adequate until PCP appointment only.   Family/ staff Communication:   Total Time:  Documentation:  Face to Face:  Family/Phone:   Labs/tests ordered:    Vikki Ports, NP-C [facility] [address], [place] [address] Cell Phone (Mon-Fri 8am-5pm):  [phone] On Call:  [phone] & follow prompts after 5pm & weekends Office Phone:  [phone] Office Fax:  [phone]

## 2015-11-24 NOTE — Op Note (Signed)
10/19/2015 - 10/20/2015  11:11 AM  PATIENT:  Caitlin Ross   MRN: [id number]  PRE-OPERATIVE DIAGNOSIS:  right femoral neck  hip fracture  POST-OPERATIVE DIAGNOSIS:  right femoral neck hip freacture  PROCEDURE:  Procedure(s): RIGHT ARTHROPLASTY BIPOLAR HIP (HEMIARTHROPLASTY)  PREOPERATIVE INDICATIONS:    Caitlin Ross is an 82 y.o. female who was admitted with a diagnosis of right hip fracture.  I have recommended surgical fixation with hemiarthroplasty for this injury. I have explained the surgery and the postoperative course to the patient and their family who have agreed with surgical management of this fracture.    The risks benefits and alternatives were discussed with the patient and their family including but not limited to the risks of  infection requiring removal of the prosthesis, bleeding requiring blood transfusion, nerve injury especially to the sciatic nerve leading to foot drop or lower extremity numbness, periprosthetic fracture, dislocation leg length discrepancy, change in lower extremity rotation persistent hip pain, loosening or failure of the components and the need for revision surgery. Medical risks include but are not limited to DVT and pulmonary embolism, myocardial infarction, stroke, pneumonia, respiratory failure and death.  OPERATIVE REPORT     SURGEON:  Thornton Park, MD  ANESTHESIA:  Spinal    COMPLICATIONS:  None.   SPECIMEN: Femoral head to pathology    COMPONENTS:  Stryker Accolade HFx femoral component size 4 with a Unitrax Endoprosthetic head 45 mm -4 neck adjustment sleeve.    PROCEDURE IN DETAIL:   The patient was met in the holding area and  identified.  The appropriate hip was identified and marked at the operative site after verbally confirming with the patient that this was the correct site of surgery.  The patient was then transported to the OR and  underwent spinal anesthesia.  The patient was then placed in the lateral decubitus position  with the operative side up and secured on the operating room table with a pegboard and all bony prominences were adequately padded. This included an axillary roll and additional padding around the nonoperative leg to prevent compression to the common peroneal nerve.    The operative lower extremity was prepped and draped in a sterile fashion.  A time out was performed prior to incision to verify patient's name, date of birth, medical record number, correct site of surgery correct procedure to be performed. The timeout was also used to verify the patient received antibiotics now appropriate instruments, implants and radiographic studies were available in the room. Once all in attendance were in agreement case began.    A posterolateral approach was utilized via sharp dissection  carried down to the subcutaneous tissue.  Bleeding vessels were coagulated using electrocautery.  The fascia lata was identified and incised along the length of the skin incision.  The gluteus maximus muscle was then split in line with its fibers. Self-retaining retractors were  inserted.  With the hip internally rotated, the short external rotators  were identified and removed from the posterior attachment from the greater trochanter. The piriformis was tagged for later repair. The capsule was identified and a T-shaped capsulotomy was performed. The capsule was tagged with #2 Tycron for later repair.  The femoral neck fracture was exposed, and the femoral head was removed using a corkscrew device. This was measured to be 45 mm in diameter. The attention was then turned to proximal femur preparation.  An oscillating saw was used to perform a proximal femoral osteotomy 1 fingerbreadth above the lesser trochanter.  The trial 45 mm femoral head was placed into the acetabulum and had an excellent suction fit. The attention was then turned back to femoral preparation.    A femoral skid and Cobra retractor were placed under the femoral neck  to allow for adequate visualization.  It was noted that the femoral neck fracture extended into the medial calcar.  A box osteotome was used to make the initial entry into the proximal femur. A single hand reamer was used to prepare the femoral canal. A T-shaped femoral canal sounder was then used to ensure no penetration femoral cortex had occurred during reaming. The proximal femur was then sequentially broached by hand. A size 4 femoral trial was found to have best medial to lateral canal fit. Once adequate mediolateral canal fill was achieved the trial femoral broach, neck, and head was assembled and the hip was reduced. It was found to have excellent stability, equivalent leg lengths with functional range of motion.  The decision was made to place a [employer] cable around the calcar given the fracture extension.  A cable passing hook was placed around the proximal femur at the level of the calcar and a [employer] cable passed.  It was tensioned and crimped.  The excess cable was cut.  The femoral trial prosthesis was then removed.  I copiously irrigated the femoral canal and then impacted the real femoral prosthesis into place into the appropriate version, slightly anteverted to the normal anatomy, and I impacted the actual 45 mmUnitrax femoral component with a -4 neck adjustment sleeve into place. The hip was then reduced and taken through functional range of motion and found to have excellent stability. Leg lengths were restored. The hip joint was copiously irrigated.   A soft tissue repair of the capsule and external rotators was performed using #2 Tycron Excellent posterior capsular repair was achieved. The fascia lata was then closed with interrupted 0 Vicryl suture. The subcutaneous tissues were closed with 2-0 Vicryl and the skin approximated with staples.   The patient was then placed supine on the operative table. Leg lengths were checked clinically and found to be equivalent. An abduction  pillow was placed between the lower extremities. The patient was then transferred to a hospital bed and brought to the PACU in stable condition. I was scrubbed and present the entire case and all sharp and instrument counts were correct at the conclusion of the case. I spoke with the patient's family in the postop consultation room to let them case was completed without complication patient was stable in recovery room.   Timoteo Gaul, MD Orthopedic Surgeon

## 2015-11-25 ENCOUNTER — Encounter: Admission: RE | Admit: 2015-11-25 | Payer: Medicare Other | Source: Ambulatory Visit | Admitting: Internal Medicine

## 2016-02-09 ENCOUNTER — Other Ambulatory Visit (INDEPENDENT_AMBULATORY_CARE_PROVIDER_SITE_OTHER): Payer: Self-pay | Admitting: Internal Medicine

## 2016-02-09 DIAGNOSIS — I7025 Atherosclerosis of native arteries of other extremities with ulceration: Secondary | ICD-10-CM

## 2016-02-11 ENCOUNTER — Encounter (INDEPENDENT_AMBULATORY_CARE_PROVIDER_SITE_OTHER): Payer: Medicare Other

## 2016-02-11 ENCOUNTER — Encounter (INDEPENDENT_AMBULATORY_CARE_PROVIDER_SITE_OTHER): Payer: Medicare Other | Admitting: Vascular Surgery

## 2016-06-09 IMAGING — CR DG ABDOMEN 3V
1 series · 4 of 4 positions shown · non-contrast
Comparison: 07/14/2008

CLINICAL DATA: Abdominal pain this morning

EXAM:
ABDOMEN SERIES

[Series 1: dxr abdomen 3-way (incl pa cxr) · 0.14mm/px · 4 of 4 slices shown]
[im 1/4]
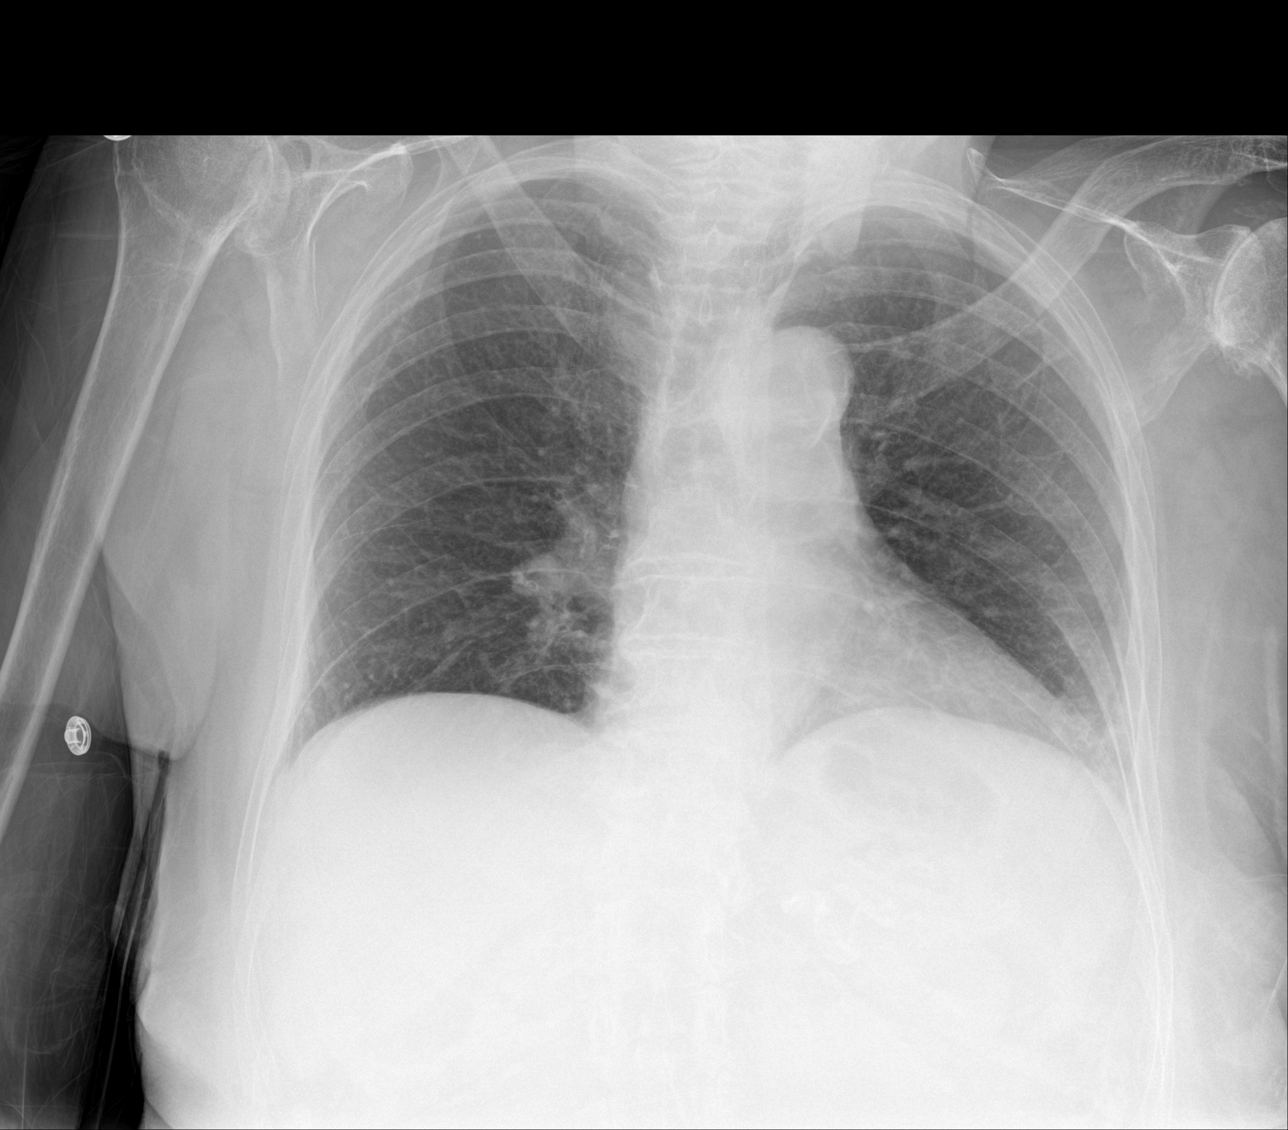
[im 2/4]
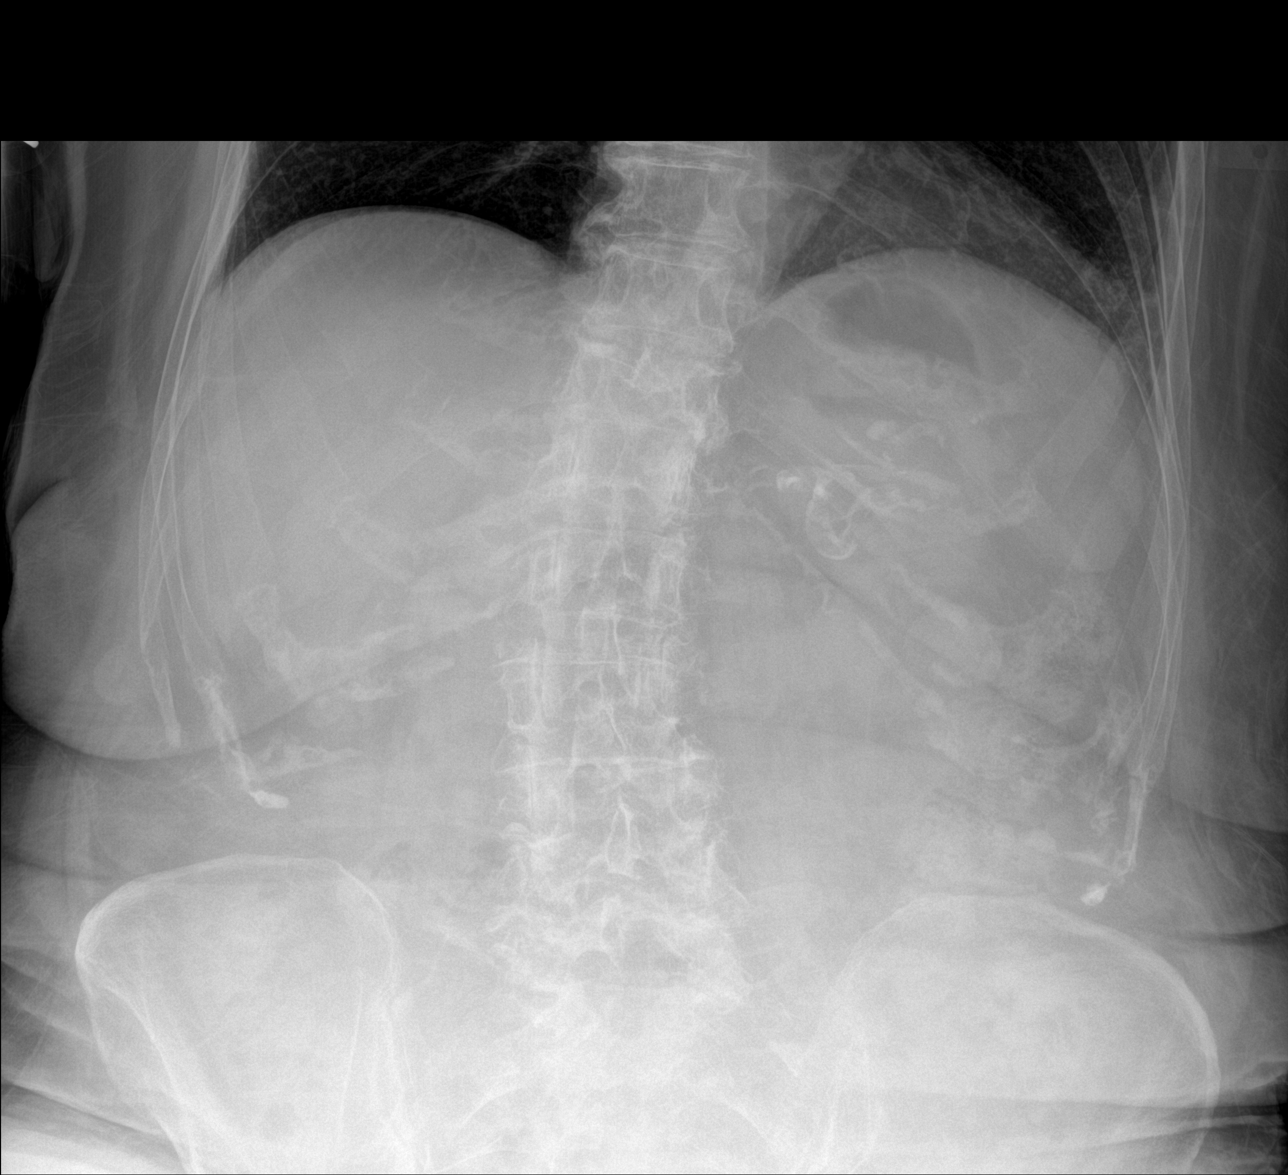
[im 3/4]
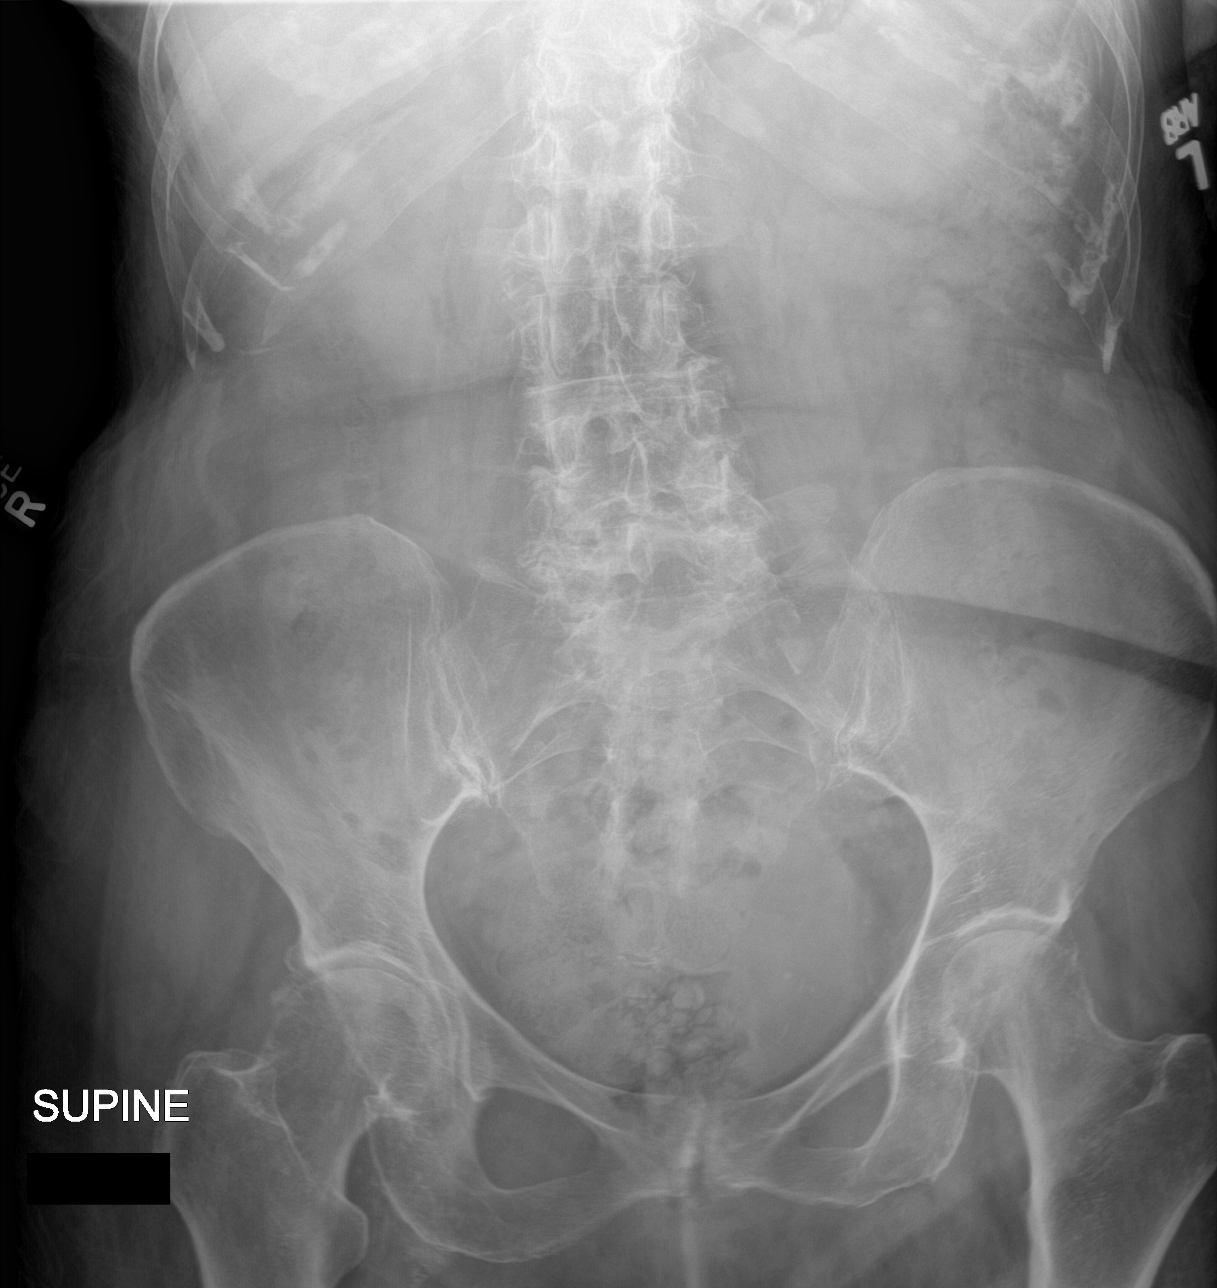
[im 4/4]
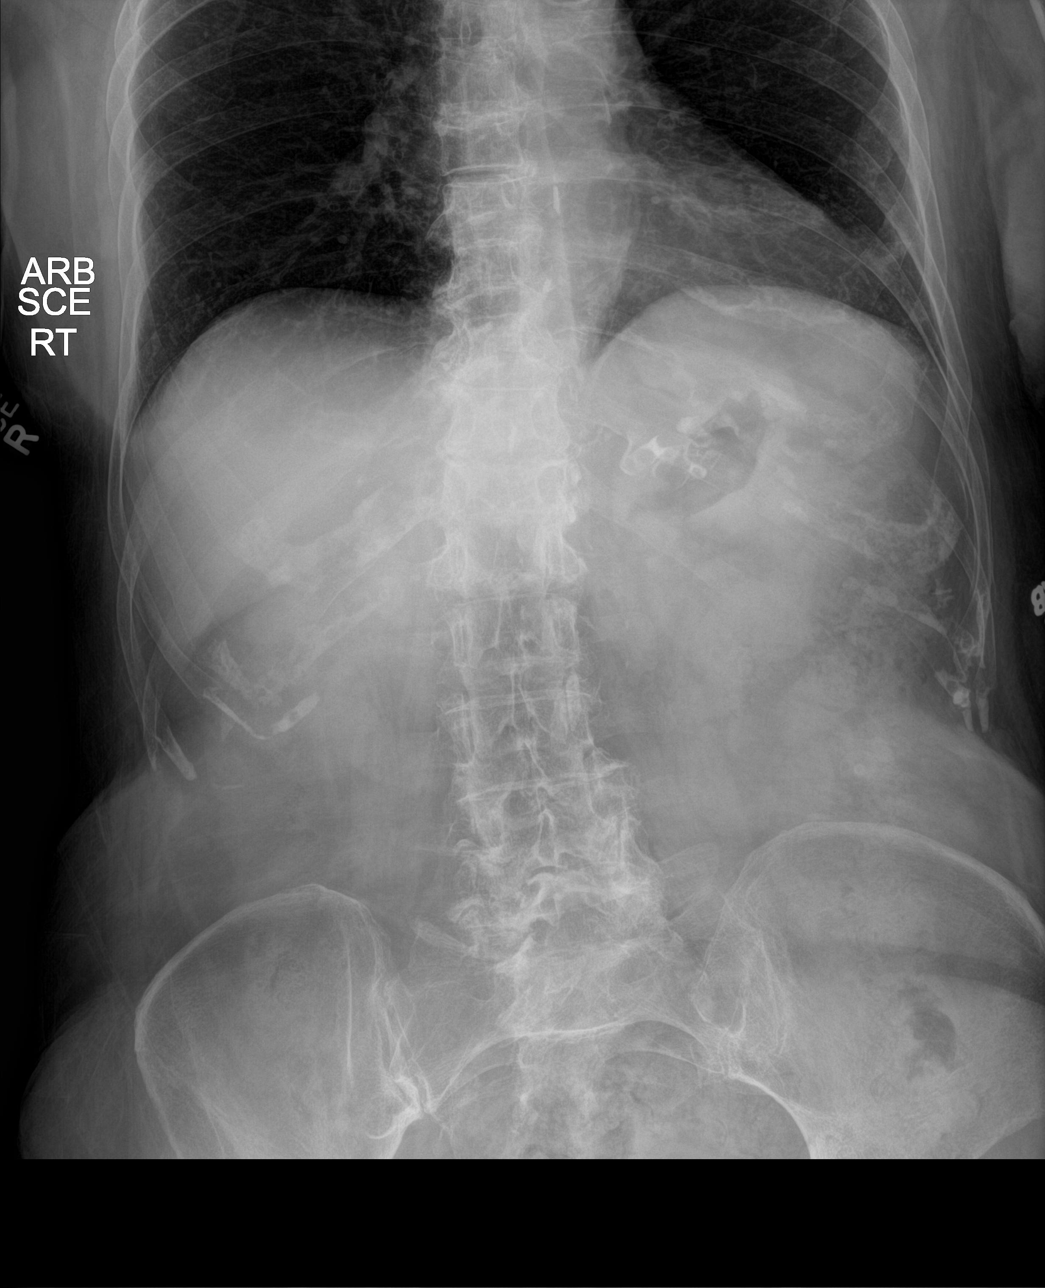

[4 of 4 positions shown; findings below may reference images not displayed]

FINDINGS: Bibasilar atelectasis.

No free intraperitoneal gas. No disproportionate dilatation of
bowel.

Osteopenia.  Degenerative changes in the lumbar spine.
IMPRESSION: Bibasilar atelectasis.  Nonobstructive bowel gas pattern.

## 2018-03-10 IMAGING — CR DG CHEST 1V
1 series · 1 of 1 positions shown · non-contrast
Comparison: 12/04/2014

CLINICAL DATA: Fall.  Right hip fracture

EXAM:
CHEST 1 VIEW

[chest ap]
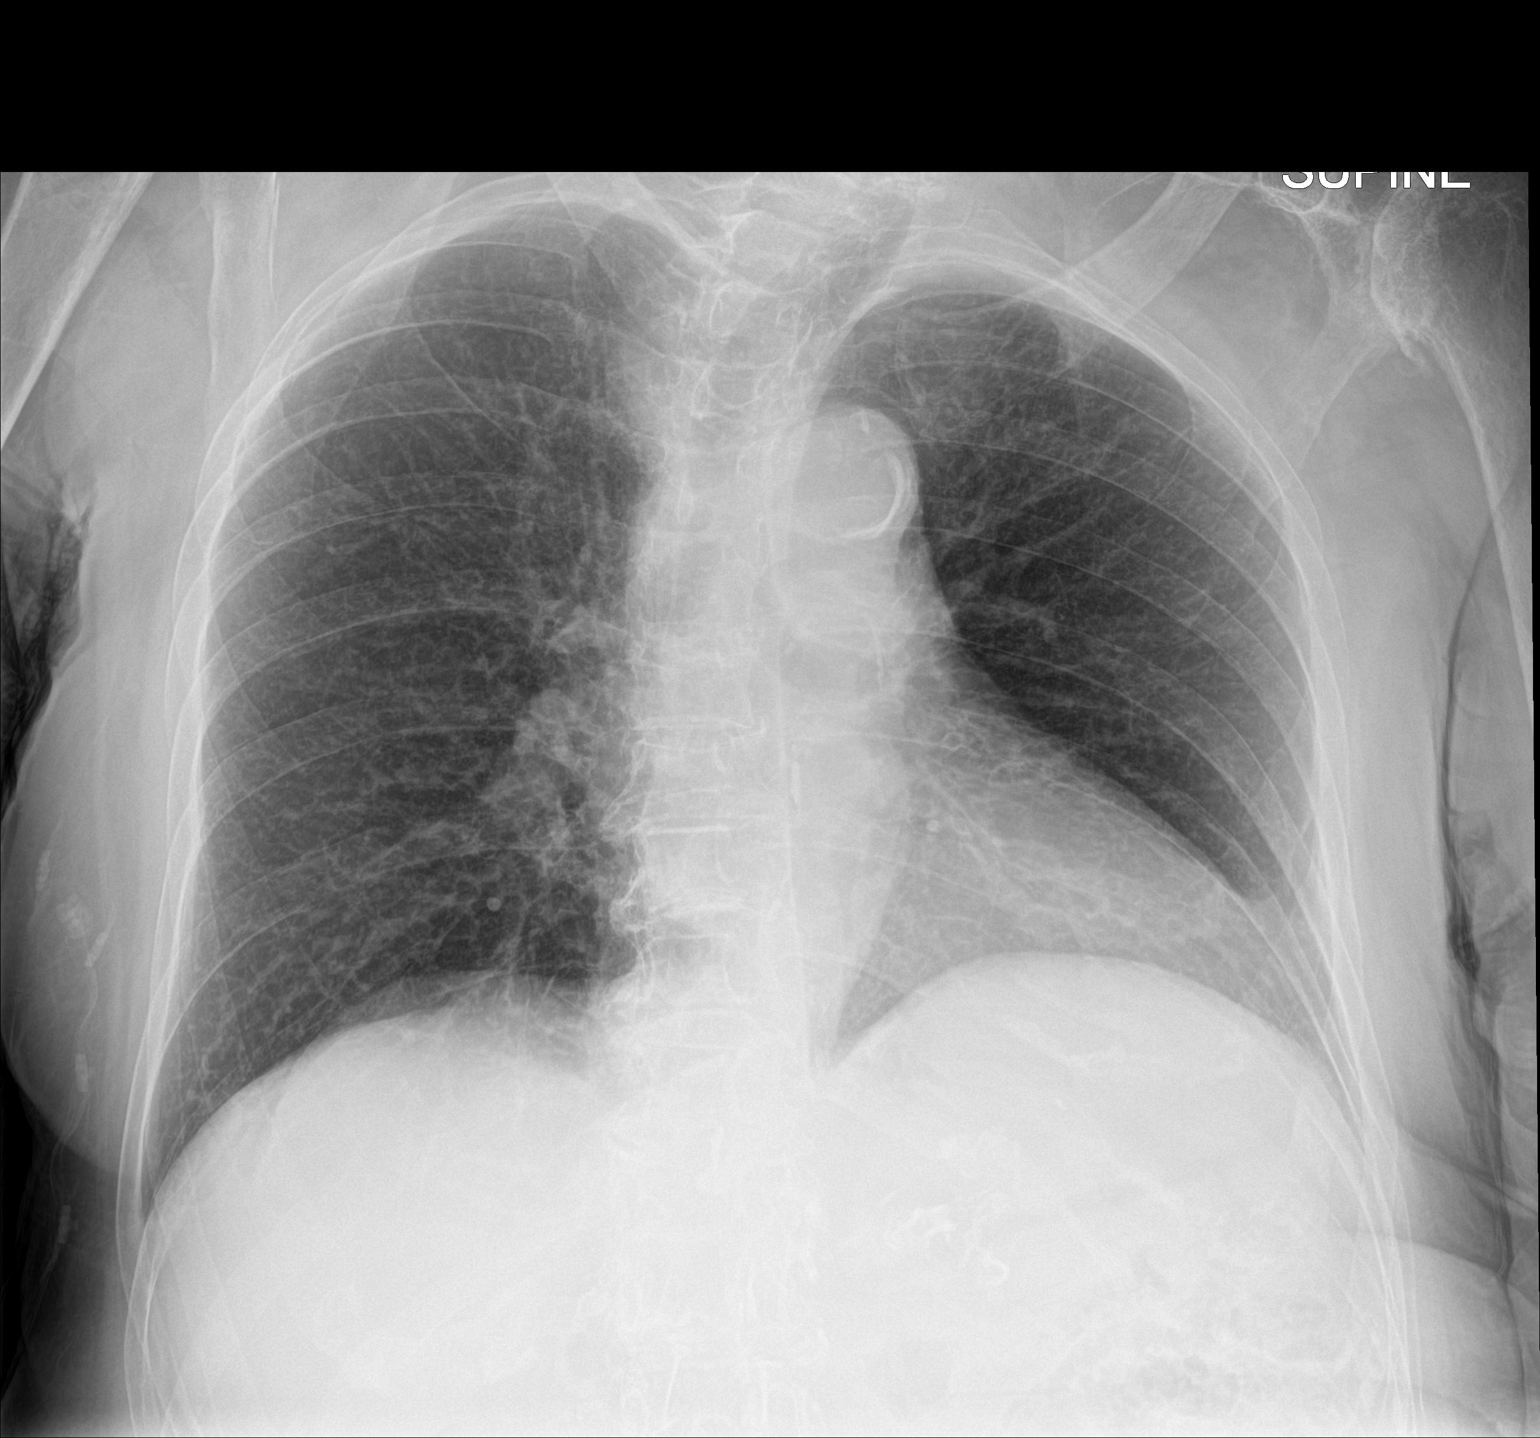

[1 of 1 positions shown; findings below may reference images not displayed]

FINDINGS: Heart size within normal limits. Negative for heart failure.
Atherosclerotic aortic arch. Lungs are clear without infiltrate or
effusion. Degenerative change in both shoulder joints.
IMPRESSION: No active disease.

## 2018-05-24 ENCOUNTER — Other Ambulatory Visit: Payer: Medicare Other | Admitting: Student

## 2018-05-24 ENCOUNTER — Other Ambulatory Visit: Payer: Self-pay

## 2018-05-24 DIAGNOSIS — Z515 Encounter for palliative care: Secondary | ICD-10-CM

## 2018-05-24 NOTE — Progress Notes (Signed)
[facility] Consult Note Telephone: [phone]  Fax: [phone]) [phone]  PATIENT NAME: Caitlin RossettiMalinda L Ross DOB: 09/18/1922 MRN: [id number]  PRIMARY CARE PROVIDER:   Maurine MinisterMiriam McLaughlin, PA-[facility]  REFERRING PROVIDER: Maurine MinisterMiriam McLaughlin, PA  RESPONSIBLE PARTY: Son, Sharlot GowdaWayne Schabel    ASSESSMENT: Due to the COVID-19 crisis, this visit was completed via telemedicine from my office and it was initiated and consent by this patient and or family. [name] closes her eyes intermittently during visit. She does nod her head yes and no to questions, she also speaks out but speech is difficult to understand. No acute distress noted. She is a FAST 7C. We discussed ongoing goals of care. We discussed pain and symptom management. We discussed aspiration precautions. We discussed her leg contracture; he is encouraged to perform light passive range of motion as patient is able tolerate; use pillows to aid in positioning. We discussed criteria for readmission to Hospice. Palliative Medicine will continue to follow and provide support, make recommendations. Will monitor for changes and declines and will refer back to hospice when she meets criteria guidelines.       RECOMMENDATIONS and PLAN:  1. Code status: DNR. 2. Medical goals of therapy: Will focus on providing comfort and symptom management. Palliative Medicine will monitor for changes and declines; will refer back to Hospice for assessment when she meets criteria guidelines. Criteria for readmission under Dementia:   The patient has both 1 and 2:  1. Stage 7C or beyond according to the FAST Scale AND  2. One or more of the following conditions in the 12 months:  Aspiration pneumonia,Pyelonephritis,Septicemia,Multiple pressure ulcers ( stage 3-4), Recurrent Fever, Other significant condition that  suggests a limited prognosis.Inability to maintain sufficient fluid and calorie intake in the past 6months ( 10%  weight loss or albumin < 2.5  gm/dl).  3. Symptom management: Recommendation: allergies-cetrizine 10mg  daily. Pain-continue tylenol 650mg  every 4 hours prn pain. Aspiration-education provided on aspiration precautions; continue pureed diet and check for pocketing. 4. Discharge Planning: [name] will continue to reside at home with her son and with family support, private caregivers.   Palliative Medicine will follow up in 6 weeks or sooner, if needed.   I spent 25 minutes providing this consultation,  from 10:00am to 10:25am. More than 50% of the time in this consultation was spent coordinating communication.   HISTORY OF PRESENT ILLNESS:  Caitlin Ross is a 85 y.o. female with multiple medical problems including Alzheimer's dementia, dysphagia,  anemia,  hypertension, herpes zoster, allergies, right hip arthroplasty, history of recurrent UTI's. Palliative Care was asked to help address goals of care; she is seen for follow up visit today. [name] resides with her son [name]. She has caregivers 6 days a week for 6 hours total daily. Patient's daughter also helps out with care. Son [name] reports patient is "doing pretty well." She is dependent for adl's. Hoyer lift is used for transfers. She does have contracture to right leg/knee; she has received therapy in the past regarding her contracture. Her appetite is "okay" and she is fed; she also receives ensures. She receives pureed diet. She will cough occasionally and pocket foods. Son denies weight loss. She receives tylenol occasionally when she "winces." No complaints of shortness of breath, nausea or constipation. She does sneeze, tries to clear her throat at night and son feels she has allergies. Son denies any recent changes in her cognition. [name] states he feels patient still recognizes  him, his sister and patient's sister, who visits regularly. No increase in sleeping. Skin intact per [place]. No falls reported. No recent infections. No recent  hospitalizations or emergency room visits. Deniece Portela states patient has upcoming visit with PCP.  CODE STATUS: DNR  PPS: 30% HOSPICE ELIGIBILITY/DIAGNOSIS: TBD  PAST MEDICAL HISTORY:  Past Medical History:  Diagnosis Date   Alzheimer's disease    Asthma    Atrophic vaginitis    Dementia    Hypertension    Urinary tract infection     SOCIAL HX:  Social History   Tobacco Use   Smoking status: Never Smoker   Smokeless tobacco: Never Used  Substance Use Topics   Alcohol use: No    Alcohol/week: 0.0 standard drinks    ALLERGIES: No Known Allergies   PERTINENT MEDICATIONS:  Outpatient Encounter Medications as of 05/24/2018  Medication Sig   acetaminophen (TYLENOL) 325 MG tablet Take 650 mg by mouth every 4 (four) hours as needed.   feeding supplement, ENSURE ENLIVE, (ENSURE ENLIVE) LIQD Take 237 mLs by mouth 2 (two) times daily between meals.   mirtazapine (REMERON) 15 MG tablet Take 15 mg by mouth at bedtime.   alum & mag hydroxide-simeth (MAALOX/MYLANTA) 200-200-20 MG/5ML suspension Take 30 mLs by mouth every 4 (four) hours as needed for indigestion. (Patient not taking: Reported on 05/24/2018)   Ascorbic Acid (C-500) 500 MG CHEW Chew by mouth.   aspirin 81 MG chewable tablet Chew 81 mg by mouth daily.   bisacodyl (DULCOLAX) 10 MG suppository Place 1 suppository (10 mg total) rectally daily as needed for moderate constipation. (Patient not taking: Reported on 05/24/2018)   Cranberry 500 MG CAPS Take by mouth.   docusate sodium (COLACE) 100 MG capsule Take 1 capsule (100 mg total) by mouth 2 (two) times daily. (Patient not taking: Reported on 05/24/2018)   enoxaparin (LOVENOX) 30 MG/0.3ML injection Inject 0.3 mLs (30 mg total) into the skin daily. (Patient not taking: Reported on 05/24/2018)   ferrous sulfate 325 (65 FE) MG tablet Take 1 tablet (325 mg total) by mouth 3 (three) times daily with meals. (Patient not taking: Reported on 05/24/2018)   guaiFENesin-codeine  (ROBITUSSIN AC) 100-10 MG/5ML syrup Take 5 mLs by mouth 3 (three) times daily as needed for cough. Reported on 04/29/2015 (Patient not taking: Reported on 05/24/2018)   methocarbamol (ROBAXIN) 500 MG tablet Take 1 tablet (500 mg total) by mouth every 6 (six) hours as needed for muscle spasms. (Patient not taking: Reported on 05/24/2018)   ondansetron (ZOFRAN ODT) 4 MG disintegrating tablet Take 1 tablet (4 mg total) by mouth every 8 (eight) hours as needed for nausea or vomiting. (Patient not taking: Reported on 10/19/2015)   polyethylene glycol (MIRALAX / GLYCOLAX) packet Take 17 g by mouth daily as needed for mild constipation. (Patient not taking: Reported on 05/24/2018)   traMADol (ULTRAM) 50 MG tablet Take 1 tablet (50 mg total) by mouth every 6 (six) hours as needed for moderate pain or severe pain. (Patient not taking: Reported on 05/24/2018)   No facility-administered encounter medications on file as of 05/24/2018.     PHYSICAL EXAM:   General: NAD, frail appearing, thin   Luella Cook, NP

## 2018-06-06 ENCOUNTER — Telehealth: Payer: Self-pay | Admitting: Student

## 2018-06-06 NOTE — Telephone Encounter (Signed)
Received message through triage that patient's son Deniece Portela had called in. Spoke with [place] and he states patient had hit her leg on hoyer lift about a week ago and they have been dressing wound and he was concerned about infection. She had recently finished an antibiotic for a uti. He is advised to contact PCP Mimi McLaughlin to obtain orders for this; he verbalizes understanding. He states he will call her.

## 2018-07-12 ENCOUNTER — Telehealth: Payer: Self-pay

## 2018-07-12 NOTE — Telephone Encounter (Signed)
Received message from patient's son, Patrick Jupiter, that patient had tremors and hiccups every 30 seconds for 3 minutes when she first woke up but now has resolved. Message sent to [facility] NP to update.

## 2018-07-13 ENCOUNTER — Telehealth: Payer: Self-pay | Admitting: Student

## 2018-07-13 NOTE — Telephone Encounter (Signed)
Palliative NP spoke with son Patrick Jupiter regarding episode yesterday where she had hiccups and tremors while she was getting up. He reports patient soon returned to her baseline and is doing well. He asked how much decline does patient have to have to be eligible for Hospice. We discussed criteria. NP offered visit; he would like to monitor her to see how she does the next couple of days. He is encouraged to call with questions.

## 2018-07-18 ENCOUNTER — Telehealth: Payer: Self-pay | Admitting: Student

## 2018-07-18 NOTE — Telephone Encounter (Signed)
Palliative NP received message to call son. Caitlin Ross states patient was observed coughing some after she ate/drank yesterday. Cough has since subsided. We discussed aspiration precautions. He is open to in person visit. Visit scheduled for Friday 07/20/2018 at 12pm.

## 2018-07-20 ENCOUNTER — Other Ambulatory Visit: Payer: Medicare Other | Admitting: Student

## 2018-07-20 ENCOUNTER — Other Ambulatory Visit: Payer: Self-pay

## 2018-07-20 DIAGNOSIS — Z515 Encounter for palliative care: Secondary | ICD-10-CM

## 2018-07-20 NOTE — Progress Notes (Signed)
[facility] Consult Note Telephone: [phone]) [phone]  Fax: [phone]  PATIENT NAME: Caitlin RossettiMalinda L Ross DOB: Jul 08, 1922 MRN: [id number]  PRIMARY CARE PROVIDER: Maurine MinisterMiriam McLaughlin, PA-[facility]   REFERRING PROVIDER:  Maurine MinisterMiriam McLaughlin, [place]  RESPONSIBLE PARTY: Son, Sharlot GowdaWayne Mcelwee  ASSESSMENT:  Ms. Caitlin Ross is sitting up to wheel chair. She does not verbalize any during visit, but does make eye contact. No acute distress noted.  Son Caitlin Ross and daughter Britta MccreedyBarbara are present. We discussed ongoing goals of care. We discussed symptom management. We also discussed criteria for readmission to Hospice services and what declines or changes to look for. NP obtained right mid arm circumference: 21.5cm. Palliative Medicine will continue to provide ongoing care and make recommendations as needed. Family does have question regarding respite care; will make SW aware.     RECOMMENDATIONS and PLAN:  1. Code status: DNR. 2. Medical goals of therapy: Will focus on providing comfort and symptom management. Palliative Medicine will monitor for changes and declines; will refer back to Hospice for assessment when she meets criteria guidelines. Criteria for readmission under Dementia:             The patient has both 1 and 2:             1. Stage 7C or beyond according to the FAST Scale AND 2. One or more of the following conditions in the 12 months:             Aspiration pneumonia,Pyelonephritis,Septicemia,Multiple pressure ulcers ( stage 3-4), Recurrent Fever, Other significant condition that          suggests a limited prognosis.Inability to maintain sufficient fluid and calorie intake in the past 6months (10% weight loss or albumin < 2.5       gm/dl).  3. Symptom management: Pain-continue tylenol prn pain. Aspiration-education provided on aspiration precautions; continue pureed diet and check for pocketing, keep patient sitting up at least 30 minutes after eating. Leg  contracture-continue to use pillows to aid in positioning.  4. Discharge Planning: Ms. Caitlin Ross will continue to reside at home with her son and with family support, private caregivers.   Palliative Medicine will follow up in 8 weeks or sooner, if needed.    I spent 40 minutes providing this consultation,  from 12:00pm to 12:40pm. More than 50% of the time in this consultation was spent coordinating communication.   HISTORY OF PRESENT ILLNESS:  Caitlin RossettiMalinda L Mousel is a 85 y.o. female with multiple medical problems including Alzheimer's dementia, dysphagia,anemia,hypertension,herpes zoster, allergies,right hip arthroplasty,history of recurrent UTI's. Palliative Care was asked to help address goals of care; she is seen for follow up visit today. Family reports patient doing well today, but she fluctuates. Son Caitlin Ross states she will do well for a couple weeks, then appears to regress. She will sleep more, but then goes back to her baseline. Her sleep varies; "she has good nights and bad nights." She naps intermittently during the day. Her appetite has been "pretty good." She does have some coughing after meals. She is receiving pureed foods. She has right leg/knee contracture; Caitlin Ross states it is "a little worse."  She has pillows between her knees. She is receiving one extra strength tylenol daily. She has not been taking aspirin. No other medications. She was treated for a urinary tract infection in the past 3 months. Dressing to left lower extremity; Caitlin Ross states skin tear is healing, dressing is more so for protection. No recent emergency room visits  or hospitalizations.  CODE STATUS: DNR  PPS: 30% HOSPICE ELIGIBILITY/DIAGNOSIS: TBD  PAST MEDICAL HISTORY:  Past Medical History:  Diagnosis Date  . Alzheimer's disease   . Asthma   . Atrophic vaginitis   . Dementia   . Hypertension   . Urinary tract infection     SOCIAL HX:  Social History   Tobacco Use  . Smoking status: Never Smoker  .  Smokeless tobacco: Never Used  Substance Use Topics  . Alcohol use: No    Alcohol/week: 0.0 standard drinks    ALLERGIES: No Known Allergies     PHYSICAL EXAM:   General: NAD, frail appearing, thin Cardiovascular: regular rate and rhythm Pulmonary: clear ant fields Abdomen: soft, nontender, + bowel sounds GU: no suprapubic tenderness Extremities: no edema, right knee contracture Skin: no rashes Neurological: Weakness but otherwise nonfocal  Ezekiel Slocumb, NP

## 2018-09-03 ENCOUNTER — Telehealth: Payer: Self-pay

## 2018-09-03 ENCOUNTER — Telehealth: Payer: Self-pay | Admitting: Student

## 2018-09-03 NOTE — Telephone Encounter (Signed)
Call to family Adria Costley about patient having fever last night no answer, message and callback number left. NP Rivers notified

## 2018-09-03 NOTE — Telephone Encounter (Signed)
Palliative NP returned call to son [place]. He states patient had a temperature of 101 in the past 3 days. He states her temperature has been 98-99 degrees in the past 24-36 hours. We discussed signs of infection to look out for. He denies patient being around anyone that has been sick recently. He states patient is per her baseline. He reports a fair appetite. Fluids are encouraged. He also talks about patient "shaking her head" at times. We discussed disease processes. He is encouraged to call with questions.

## 2018-09-14 ENCOUNTER — Telehealth: Payer: Self-pay

## 2018-09-14 NOTE — Telephone Encounter (Signed)
Received a message to call son Patrick Jupiter. Phone call placed to patient's son, Patrick Jupiter, who reported that patient had some discharge over the past 2 days but has seemed to resolve. Patient has remained afebrile and does not appear to be in any discomfort. Discussed with Wayne signs of urinary infection and the importance of fluid intake. Patrick Jupiter shared that patient has not displayed any changes in her behaviors and has been taking fluids. Encouraged Wayne to continue to observe patient for changes and to call with any concerns or questions

## 2019-01-02 ENCOUNTER — Telehealth: Payer: Self-pay | Admitting: Adult Health Nurse Practitioner

## 2019-01-02 NOTE — Telephone Encounter (Signed)
Returned son's call.  Had questions about using bulb syringe to get liquids out of his mom's mouth when she pockets it in her mouth and won't swallow. Had been using one that is getting worn out and wanted to know where to buy one. Discussed that this is okay to use and that it could be found in any baby department or pharmacy.  Had questions about possible decline.  Set up appointment on 01/15/2019 at noon to go over further Caitlin Loewe K. Olena Heckle NP

## 2019-01-11 ENCOUNTER — Telehealth: Payer: Self-pay | Admitting: Adult Health Nurse Practitioner

## 2019-01-11 NOTE — Telephone Encounter (Signed)
Returning son's call to reschedule appointment.  Rescheduled appointment from 01/15/2019 to 02/04/2019 at 12pm Babbette Dalesandro K. Olena Heckle NP

## 2019-02-04 ENCOUNTER — Other Ambulatory Visit: Payer: Medicare Other | Admitting: Adult Health Nurse Practitioner

## 2019-02-04 ENCOUNTER — Other Ambulatory Visit: Payer: Self-pay

## 2019-02-04 DIAGNOSIS — F0280 Dementia in other diseases classified elsewhere without behavioral disturbance: Secondary | ICD-10-CM

## 2019-02-04 DIAGNOSIS — Z515 Encounter for palliative care: Secondary | ICD-10-CM

## 2019-02-04 NOTE — Progress Notes (Signed)
[place] Consult Note Telephone: [phone]  Fax: [phone]  PATIENT NAME: Caitlin Ross DOB: [birth year] MRN: [id number]  PRIMARY CARE PROVIDER:   Paulita Cradle, PA-[facility]   REFERRING PROVIDER:  Paulita Cradle, PA-[facility]    RESPONSIBLE PARTY:   Sharaya, [place]  [phone]      RECOMMENDATIONS and PLAN:  1.  Advanced care planning.  Patient is a DNR  2.  Dementia.  FAST 7c.  Patient is nonambulatory.  She is assisted to wheelchair daily.  She is propped up in her wheelchair today with support on both sides.  She has contracture to right knee.  She is nonverbal.  Family and caregiver state that every now and then she may grimace as if in pain but mostly she does not seem like she is in pain.  She appears comfortable in NAD today.  She has pillow between her knees.  Caregiver states that she used to have a brace for the contracted right knee but it no longer fits like it used to.  She may benefit with a PT evaluation for a new brace that fits to help prevent further contracture.  She requires assistance with all ADLs including feeding.  She has been pocketing food more.  Caregiver keeps her seated at 90 degrees during feeding.  Does state that she will hold her food in her mouth but will eventually eat good meals.  No reported weight loss.  No anxiety/agitation.  Will reach out to PCP for home health PT and ST to evaluate for brace for right knee and pocketing food.    Overall patient is stable.  No falls, infections, or hospital visits since last visit.  Continue current plan of care and palliative will continue to monitor for symptom management and/or decline and will make recommendations as needed.  Son stated that her mattress on her hospital bed is worn and sagging and has already reached out PCP for order for new one.  I spent 40 minutes providing this consultation, which included time with patient and  family, chart review, provider collaboration, and documentation. More than 50% of the time in this consultation was spent coordinating communication.   HISTORY OF PRESENT ILLNESS:  Caitlin Ross is a 86 y.o. year old female with multiple medical problems including Alzheimer's dementia, dysphagia,anemia,hypertension,herpes zoster, allergies,right hip arthroplasty,history ofrecurrent UTI's. Palliative Care was asked to help address goals of care.   CODE STATUS: DNR  PPS: 30% HOSPICE ELIGIBILITY/DIAGNOSIS: TBD  PHYSICAL EXAM:  HR  65  O2 98% on RA General: NAD, frail appearing, thin Cardiovascular: regular rate and rhythm Pulmonary: lungs sounds clear; normal respiratory effort Abdomen: soft, nontender, + bowel sounds GU: no suprapubic tenderness Extremities: no edema, contracture of right knee Skin: no rashes Neurological: Weakness;  A&O to person and place.  Not verbal  PAST MEDICAL HISTORY:  Past Medical History:  Diagnosis Date  . Alzheimer's disease   . Asthma   . Atrophic vaginitis   . Dementia   . Hypertension   . Urinary tract infection     SOCIAL HX:  Social History   Tobacco Use  . Smoking status: Never Smoker  . Smokeless tobacco: Never Used  Substance Use Topics  . Alcohol use: No    Alcohol/week: 0.0 standard drinks    ALLERGIES: No Known Allergies   PERTINENT MEDICATIONS:  Outpatient Encounter Medications as of 02/04/2019  Medication Sig  . acetaminophen (TYLENOL) 325 MG tablet Take  650 mg by mouth every 4 (four) hours as needed.  Marland Kitchen alum & mag hydroxide-simeth (MAALOX/MYLANTA) 200-200-20 MG/5ML suspension Take 30 mLs by mouth every 4 (four) hours as needed for indigestion. (Patient not taking: Reported on 05/24/2018)  . Ascorbic Acid (C-500) 500 MG CHEW Chew by mouth.  Marland Kitchen aspirin 81 MG chewable tablet Chew 81 mg by mouth daily.  . bisacodyl (DULCOLAX) 10 MG suppository Place 1 suppository (10 mg total) rectally daily as needed for moderate constipation.  (Patient not taking: Reported on 05/24/2018)  . Cranberry 500 MG CAPS Take by mouth.  . docusate sodium (COLACE) 100 MG capsule Take 1 capsule (100 mg total) by mouth 2 (two) times daily. (Patient not taking: Reported on 05/24/2018)  . enoxaparin (LOVENOX) 30 MG/0.3ML injection Inject 0.3 mLs (30 mg total) into the skin daily. (Patient not taking: Reported on 05/24/2018)  . feeding supplement, ENSURE ENLIVE, (ENSURE ENLIVE) LIQD Take 237 mLs by mouth 2 (two) times daily between meals.  . ferrous sulfate 325 (65 FE) MG tablet Take 1 tablet (325 mg total) by mouth 3 (three) times daily with meals. (Patient not taking: Reported on 05/24/2018)  . guaiFENesin-codeine (ROBITUSSIN AC) 100-10 MG/5ML syrup Take 5 mLs by mouth 3 (three) times daily as needed for cough. Reported on 04/29/2015 (Patient not taking: Reported on 05/24/2018)  . methocarbamol (ROBAXIN) 500 MG tablet Take 1 tablet (500 mg total) by mouth every 6 (six) hours as needed for muscle spasms. (Patient not taking: Reported on 05/24/2018)  . mirtazapine (REMERON) 15 MG tablet Take 15 mg by mouth at bedtime.  . ondansetron (ZOFRAN ODT) 4 MG disintegrating tablet Take 1 tablet (4 mg total) by mouth every 8 (eight) hours as needed for nausea or vomiting. (Patient not taking: Reported on 10/19/2015)  . polyethylene glycol (MIRALAX / GLYCOLAX) packet Take 17 g by mouth daily as needed for mild constipation. (Patient not taking: Reported on 05/24/2018)  . traMADol (ULTRAM) 50 MG tablet Take 1 tablet (50 mg total) by mouth every 6 (six) hours as needed for moderate pain or severe pain. (Patient not taking: Reported on 07/20/2018)   No facility-administered encounter medications on file as of 02/04/2019.      Zeric Baranowski Marlena Clipper, NP

## 2019-02-06 ENCOUNTER — Telehealth: Payer: Self-pay

## 2019-02-06 NOTE — Telephone Encounter (Signed)
At the direction of Amy NP, phone call placed to PCP to request  Physical therapy to evaluate patient for proper fitting brace for her contractures and Speech therapy as patient is pocketing food. Spoke with [facility].

## 2019-02-07 ENCOUNTER — Telehealth: Payer: Self-pay

## 2019-02-07 NOTE — Telephone Encounter (Signed)
Received phone call from PCP office regarding request for PT and ST. PCP in agreement and will send orders to [facility]

## 2019-03-28 ENCOUNTER — Telehealth: Payer: Self-pay

## 2019-03-28 NOTE — Telephone Encounter (Signed)
Returned call to son, Caitlin Ross regarding patient having slight redness to buttocks.  He reports that she had loose stool recently and now has irritation.  They had been applying duoderm.  RN instructed son to keep area clean, dry and turn patient very often.  Also, suggested to apply skin barrier cream to repel moisture. Son will call if changes.

## 2019-04-17 ENCOUNTER — Telehealth: Payer: Self-pay | Admitting: Adult Health Nurse Practitioner

## 2019-04-17 NOTE — Telephone Encounter (Signed)
Returned son's call.  Has concerns that previous hip replacement may have displaced.  He has appointment set up with Dr. Martha Clan for 04/29/19.  He states that she does not seem like she is in pain. Patient is bed/wheelchair bound.  Instructed that if she shows signs of pain, such as holding her hip or grimacing, that he can use heat or cold treatment and Tylenol.  Encouraged to call me back if she seems to be in a lot of pain and we can prescribe Tramadol to help til she sees Dr. Martha Clan.  Offered to set up appointment and he wanted to wait and see what Dr. Martha Clan says about her hip.  Encouraged to call with any concerns. Shelby Anderle K. Garner Nash NP

## 2019-04-19 ENCOUNTER — Other Ambulatory Visit: Payer: Medicare Other | Admitting: Adult Health Nurse Practitioner

## 2019-04-19 ENCOUNTER — Other Ambulatory Visit: Payer: Self-pay

## 2019-04-19 DIAGNOSIS — G309 Alzheimer's disease, unspecified: Secondary | ICD-10-CM

## 2019-04-19 DIAGNOSIS — Z515 Encounter for palliative care: Secondary | ICD-10-CM

## 2019-04-19 DIAGNOSIS — F0280 Dementia in other diseases classified elsewhere without behavioral disturbance: Secondary | ICD-10-CM

## 2019-04-19 NOTE — Progress Notes (Signed)
[place] Consult Note Telephone: [phone]  Fax: [phone]  PATIENT NAME: Caitlin Ross DOB: [birth year] MRN: [id number]  PRIMARY CARE PROVIDER:   Paulita Cradle, PA-[facility] REFERRING PROVIDER:  Paulita Cradle, PA-[facility]  RESPONSIBLE PARTY:   Shadell, Brenn  [phone]       RECOMMENDATIONS and PLAN:  1.  Advanced care planning.  Patient is a DNR  2.  Dementia.  FAST 7c.  Patient is nonambulatory.  She is assisted to wheelchair daily.  She is propped up in her wheelchair today with support on both sides.  She has contracture to right knee.  She is nonverbal. She requires assistance with all ADLs including feeding.  She is incontinent of B&B.  3.  Pressure injury.  Patient has unstagable wound to mid back (see details below).  Caregiver and family have been turning frequently and applying barrier cream.  She does have an air mattress.  Present air mattress has a leak in it and son is in process of getting a new one.  Have eprescribed santyl ointment to be applied daily and cover with dry absorbent dressing. Have reached out to PCP, Jerilynn Mages PA, for home health nurse assessment of the wound to determine frequency of assessment and any changes in wound care.  Did discuss with son that if it is a nonhealing wound may need to reassess for hospice services.    4.  Right hip protrusion.  Son has concerns that previous right hip replacement may have come out of socket.  She does have protrusion at right hip with palpation but she has no signs of pain with palpation of her right hip.  Her right leg in contracted.  Son states that she does have appointment with Dr. Mack Guise, who performed the hip replacement, on 04/29/19.  Will call back after this appointment to determine next steps and set up follow up appointment  No falls, infections, or hospital visits since last visit.  Continue current plan of  care and palliative will continue to monitor for symptom management and/or decline and will make recommendations as needed.  I spent 50 minutes providing this consultation,  from 12:00 to 12:50 including time with patient and family, chart review, provider collaboration, and documentation. More than 50% of the time in this consultation was spent coordinating communication.   HISTORY OF PRESENT ILLNESS:  Caitlin Ross is a 86 y.o. year old female with multiple medical problems including Alzheimer's dementia,dysphagia,anemia,hypertension,herpes zoster, allergies,right hip arthroplasty,history ofrecurrent UTI's. Palliative Care was asked to help address goals of care.   CODE STATUS: DNR  PPS: 30% HOSPICE ELIGIBILITY/DIAGNOSIS: TBD  PHYSICAL EXAM:   General: NAD, frail appearing, thin Extremities: no edema, contracture of right knee;  Patient has protrusion around right hip hard to tell if related to hip replacement or if related to her leg being ; non signs of pain when right hip palpated Skin: on mid back has unstagable pressure injury; slough noted in center with purplish red area around it that has areas that are nonblanchable and blanchable the further away from the slough. Neurological: Weakness; nonverbal   PAST MEDICAL HISTORY:  Past Medical History:  Diagnosis Date  . Alzheimer's disease   . Asthma   . Atrophic vaginitis   . Dementia   . Hypertension   . Urinary tract infection     SOCIAL HX:  Social History   Tobacco Use  . Smoking status: Never Smoker  .  Smokeless tobacco: Never Used  Substance Use Topics  . Alcohol use: No    Alcohol/week: 0.0 standard drinks    ALLERGIES: No Known Allergies   PERTINENT MEDICATIONS:  Outpatient Encounter Medications as of 04/19/2019  Medication Sig  . acetaminophen (TYLENOL) 325 MG tablet Take 650 mg by mouth every 4 (four) hours as needed.  Marland Kitchen alum & mag hydroxide-simeth (MAALOX/MYLANTA) 200-200-20 MG/5ML suspension Take 30  mLs by mouth every 4 (four) hours as needed for indigestion. (Patient not taking: Reported on 05/24/2018)  . Ascorbic Acid (C-500) 500 MG CHEW Chew by mouth.  Marland Kitchen aspirin 81 MG chewable tablet Chew 81 mg by mouth daily.  . bisacodyl (DULCOLAX) 10 MG suppository Place 1 suppository (10 mg total) rectally daily as needed for moderate constipation. (Patient not taking: Reported on 05/24/2018)  . Cranberry 500 MG CAPS Take by mouth.  . docusate sodium (COLACE) 100 MG capsule Take 1 capsule (100 mg total) by mouth 2 (two) times daily. (Patient not taking: Reported on 05/24/2018)  . enoxaparin (LOVENOX) 30 MG/0.3ML injection Inject 0.3 mLs (30 mg total) into the skin daily. (Patient not taking: Reported on 05/24/2018)  . feeding supplement, ENSURE ENLIVE, (ENSURE ENLIVE) LIQD Take 237 mLs by mouth 2 (two) times daily between meals.  . ferrous sulfate 325 (65 FE) MG tablet Take 1 tablet (325 mg total) by mouth 3 (three) times daily with meals. (Patient not taking: Reported on 05/24/2018)  . guaiFENesin-codeine (ROBITUSSIN AC) 100-10 MG/5ML syrup Take 5 mLs by mouth 3 (three) times daily as needed for cough. Reported on 04/29/2015 (Patient not taking: Reported on 05/24/2018)  . methocarbamol (ROBAXIN) 500 MG tablet Take 1 tablet (500 mg total) by mouth every 6 (six) hours as needed for muscle spasms. (Patient not taking: Reported on 05/24/2018)  . mirtazapine (REMERON) 15 MG tablet Take 15 mg by mouth at bedtime.  . ondansetron (ZOFRAN ODT) 4 MG disintegrating tablet Take 1 tablet (4 mg total) by mouth every 8 (eight) hours as needed for nausea or vomiting. (Patient not taking: Reported on 10/19/2015)  . polyethylene glycol (MIRALAX / GLYCOLAX) packet Take 17 g by mouth daily as needed for mild constipation. (Patient not taking: Reported on 05/24/2018)  . traMADol (ULTRAM) 50 MG tablet Take 1 tablet (50 mg total) by mouth every 6 (six) hours as needed for moderate pain or severe pain. (Patient not taking: Reported on  07/20/2018)   No facility-administered encounter medications on file as of 04/19/2019.     Mahitha Hickling Marlena Clipper, NP

## 2019-04-25 ENCOUNTER — Telehealth: Payer: Self-pay | Admitting: Adult Health Nurse Practitioner

## 2019-04-25 NOTE — Telephone Encounter (Signed)
Returned son's call.  States that the santyl ointment for her wound is working and that it appears that the wound is starting to close.  Offered visit to check on wound.  Son states that he will call and set up an appointment if he feels the wound starts getting worse.  Amy K. Garner Nash NP

## 2019-04-30 ENCOUNTER — Telehealth: Payer: Self-pay | Admitting: Adult Health Nurse Practitioner

## 2019-04-30 DIAGNOSIS — Z515 Encounter for palliative care: Secondary | ICD-10-CM

## 2019-04-30 DIAGNOSIS — F0280 Dementia in other diseases classified elsewhere without behavioral disturbance: Secondary | ICD-10-CM

## 2019-04-30 NOTE — Telephone Encounter (Signed)
Spoke with son to follow up on wound to mid back.  States that the santyl ointment is doing well and he has noticed improvement in the wound.  States that she was seen by Dr. Martha Clan for possible displacement of the right hip replacement.  Xrays did not show anything concerning but could be having slight displacement with the the contracture of her right leg.  Received orders for home health wound nurse to follow the wound on her back.  Discussed pain she may be having with the wound and she is having restless nights. Will try Tylenol regular strength scheduled BID to see if this may help with any pain she may be having with the wound.  Will call back in 2 weeks to follow up. Chelsy Parrales K. Garner Nash NP Spent 20 minutes from 3:40 to 4:00 speaking with patient's son, chart review and documentation

## 2019-05-03 ENCOUNTER — Other Ambulatory Visit: Payer: Self-pay

## 2019-05-03 ENCOUNTER — Non-Acute Institutional Stay: Payer: Medicare Other | Admitting: Adult Health Nurse Practitioner

## 2019-05-03 DIAGNOSIS — F0280 Dementia in other diseases classified elsewhere without behavioral disturbance: Secondary | ICD-10-CM

## 2019-05-03 DIAGNOSIS — Z515 Encounter for palliative care: Secondary | ICD-10-CM

## 2019-05-03 NOTE — Progress Notes (Signed)
[place] Consult Note Telephone: [phone]  Fax: [phone]  PATIENT NAME: Caitlin Ross DOB: [birth year] MRN: [id number]  PRIMARY CARE PROVIDER:  Paulita Cradle, PA-[facility] Modena REFERRING PROVIDER:  Paulita Cradle, PA-[facility]  RESPONSIBLE PARTY:   Sherryll Burger [phone]        RECOMMENDATIONS and PLAN:  1.  Advanced care planning. Patient is a DNR  2.  Dementia. FAST 7c. Patient is nonambulatory. She is assisted to wheelchair daily. She is propped up in her wheelchair today with support on both sides. She has contracture to right knee. She is nonverbal. She requires assistance with all ADLs including feeding.  She is incontinent of B&B.  3.  Wounds.  Patient has pressure injury to mid back, see below.  Still has noted slough covering the wound but appears to have decreased in size slightly.  Reordered santyl ointment today. Patient has new wound to left lower leg, see below.  Son states that they noticed it bleeding last night.  Unsure if started with ecchymosis.  They are putting neosporin and dry dressing on left leg wound.  Encouraged to keep doing this. Patient has air mattress but per son and caregiver state it has a leak.  Possible that wounds are occurring due to mattress not working as it should.  Son has called DME supply company and PCP to get this replaced.  Patient needs to have air mattress replaced to help with healing of the wounds.  Will reach out to RN navigator to see if we can help in this process.  Patient was seen at Dr. Harden Mo office on 04/29/19 and he had ordered home health RN to help with wound care.  Son states that he has not heard anything from the home health agency.  He is unsure which agency will be contacting him.  Have encouraged him to call Dr. Harden Mo office to check on status of the order.  I spent 50 minutes providing this consultation,  from 12:15 to  1:05 including time with patient and family, chart review, provider collaboration, and documentation. More than 50% of the time in this consultation was spent coordinating communication.   HISTORY OF PRESENT ILLNESS:  Caitlin Ross is a 86 y.o. year old female with multiple medical problems including Alzheimer's dementia,dysphagia,anemia,hypertension,herpes zoster, allergies,right hip arthroplasty,history ofrecurrent UTI's. Palliative Care was asked to help address goals of care.   CODE STATUS: DNR  PPS: 30% HOSPICE ELIGIBILITY/DIAGNOSIS: TBD  PHYSICAL EXAM:   General: NAD, frail appearing, thin Extremities: no edema, contracture of right knee Skin: on mid back has unstagable pressure injury; slough noted in center with purplish red area around it that is blanchable. The wound is approximately 5.5 cm by 3 cm.  She has new wound to left medial lower leg that is approximately 0.5 cm by 0.5 cm and 0.25 cm deep. Neurological: Weakness; nonverbal   PAST MEDICAL HISTORY:  Past Medical History:  Diagnosis Date  . Alzheimer's disease   . Asthma   . Atrophic vaginitis   . Dementia   . Hypertension   . Urinary tract infection     SOCIAL HX:  Social History   Tobacco Use  . Smoking status: Never Smoker  . Smokeless tobacco: Never Used  Substance Use Topics  . Alcohol use: No    Alcohol/week: 0.0 standard drinks    ALLERGIES: No Known Allergies   PERTINENT MEDICATIONS:  Outpatient Encounter Medications as of 05/03/2019  Medication Sig  .  acetaminophen (TYLENOL) 325 MG tablet Take 650 mg by mouth every 4 (four) hours as needed.  Marland Kitchen alum & mag hydroxide-simeth (MAALOX/MYLANTA) 200-200-20 MG/5ML suspension Take 30 mLs by mouth every 4 (four) hours as needed for indigestion. (Patient not taking: Reported on 05/24/2018)  . Ascorbic Acid (C-500) 500 MG CHEW Chew by mouth.  Marland Kitchen aspirin 81 MG chewable tablet Chew 81 mg by mouth daily.  . bisacodyl (DULCOLAX) 10 MG suppository Place 1  suppository (10 mg total) rectally daily as needed for moderate constipation. (Patient not taking: Reported on 05/24/2018)  . Cranberry 500 MG CAPS Take by mouth.  . docusate sodium (COLACE) 100 MG capsule Take 1 capsule (100 mg total) by mouth 2 (two) times daily. (Patient not taking: Reported on 05/24/2018)  . enoxaparin (LOVENOX) 30 MG/0.3ML injection Inject 0.3 mLs (30 mg total) into the skin daily. (Patient not taking: Reported on 05/24/2018)  . feeding supplement, ENSURE ENLIVE, (ENSURE ENLIVE) LIQD Take 237 mLs by mouth 2 (two) times daily between meals.  . ferrous sulfate 325 (65 FE) MG tablet Take 1 tablet (325 mg total) by mouth 3 (three) times daily with meals. (Patient not taking: Reported on 05/24/2018)  . guaiFENesin-codeine (ROBITUSSIN AC) 100-10 MG/5ML syrup Take 5 mLs by mouth 3 (three) times daily as needed for cough. Reported on 04/29/2015 (Patient not taking: Reported on 05/24/2018)  . methocarbamol (ROBAXIN) 500 MG tablet Take 1 tablet (500 mg total) by mouth every 6 (six) hours as needed for muscle spasms. (Patient not taking: Reported on 05/24/2018)  . mirtazapine (REMERON) 15 MG tablet Take 15 mg by mouth at bedtime.  . ondansetron (ZOFRAN ODT) 4 MG disintegrating tablet Take 1 tablet (4 mg total) by mouth every 8 (eight) hours as needed for nausea or vomiting. (Patient not taking: Reported on 10/19/2015)  . polyethylene glycol (MIRALAX / GLYCOLAX) packet Take 17 g by mouth daily as needed for mild constipation. (Patient not taking: Reported on 05/24/2018)  . traMADol (ULTRAM) 50 MG tablet Take 1 tablet (50 mg total) by mouth every 6 (six) hours as needed for moderate pain or severe pain. (Patient not taking: Reported on 07/20/2018)   No facility-administered encounter medications on file as of 05/03/2019.     Taylor Spilde Marlena Clipper, NP

## 2019-05-09 ENCOUNTER — Other Ambulatory Visit: Payer: Medicare Other | Admitting: Adult Health Nurse Practitioner

## 2019-05-09 ENCOUNTER — Other Ambulatory Visit: Payer: Self-pay

## 2019-05-09 DIAGNOSIS — F0280 Dementia in other diseases classified elsewhere without behavioral disturbance: Secondary | ICD-10-CM

## 2019-05-09 DIAGNOSIS — Z515 Encounter for palliative care: Secondary | ICD-10-CM

## 2019-05-09 NOTE — Progress Notes (Signed)
Therapist, nutritional Palliative Care Consult Note Telephone: [phone]  Fax: [phone]  PATIENT NAME: Caitlin Ross DOB: 1922/05/21 MRN: [id number]  PRIMARY CARE PROVIDER:   Lyndon Code, MD  REFERRING PROVIDER:  Lyndon Code, MD [address] [place],  [place] [address]  RESPONSIBLE PARTY:   Tempie Donning [phone]       RECOMMENDATIONS and PLAN:  1.  Advanced care planning. Patient is a DNR  2.  Today was a wound check to monitor wounds until home health wound nurse started.  Did find out that the wound nurse has started coming to the home. Still does not have new air mattress to replace the one that is leaking air.  Wound on mid back appears to be have shrunk slightly from last week.  Wound on left lower leg has started having some drainage, see below.  Continue wound care by home health nurse.  3.   Dementia. FAST 7c. Patient is nonambulatory. She is assisted to wheelchair daily. She is propped up in her wheelchair today with support on both sides. She has contracture to right knee. She is nonverbal. She requires assistance with all ADLs including feeding.She is incontinent of B&B.  Continue supportive care at home  Palliative care will continue to monitor for symptom management/decline and make recommendations as needed.  Will call for follow up in 4 weeks  I spent 35 minutes providing this consultation,  from 3:00 to 3:35. More than 50% of the time in this consultation was spent coordinating communication.   HISTORY OF PRESENT ILLNESS:  Caitlin Ross is a 86 y.o. year old female with multiple medical problems including Alzheimer's dementia,dysphagia,anemia,hypertension,herpes zoster, allergies,right hip arthroplasty,history ofrecurrent UTI's. Palliative Care was asked to help address goals of care.   CODE STATUS: DNR  PPS: 30% HOSPICE ELIGIBILITY/DIAGNOSIS: TBD  PHYSICAL EXAM:   General: NAD, frail appearing,  thin Extremities: no edema, contracture of right knee Skin: on mid back has unstagable pressure injury; slough noted in center with purplish red area around it that is blanchable. The wound is approximately 5 cm by 3 cm.  Wound to left medial lower leg that is approximately 0.5 cm by 0.5 cm with small amount of serosanguinous drainage and appears to have a bandage in the center but it does not cover the entire wound Neurological: Weakness; nonverbal  PAST MEDICAL HISTORY:  Past Medical History:  Diagnosis Date  . Alzheimer's disease   . Asthma   . Atrophic vaginitis   . Dementia   . Hypertension   . Urinary tract infection     SOCIAL HX:  Social History   Tobacco Use  . Smoking status: Never Smoker  . Smokeless tobacco: Never Used  Substance Use Topics  . Alcohol use: No    Alcohol/week: 0.0 standard drinks    ALLERGIES: No Known Allergies   PERTINENT MEDICATIONS:  Outpatient Encounter Medications as of 05/09/2019  Medication Sig  . acetaminophen (TYLENOL) 325 MG tablet Take 650 mg by mouth every 4 (four) hours as needed.  Marland Kitchen alum & mag hydroxide-simeth (MAALOX/MYLANTA) 200-200-20 MG/5ML suspension Take 30 mLs by mouth every 4 (four) hours as needed for indigestion. (Patient not taking: Reported on 05/24/2018)  . Ascorbic Acid (C-500) 500 MG CHEW Chew by mouth.  Marland Kitchen aspirin 81 MG chewable tablet Chew 81 mg by mouth daily.  . bisacodyl (DULCOLAX) 10 MG suppository Place 1 suppository (10 mg total) rectally daily as needed for moderate constipation. (Patient not taking: Reported  on 05/24/2018)  . Cranberry 500 MG CAPS Take by mouth.  . docusate sodium (COLACE) 100 MG capsule Take 1 capsule (100 mg total) by mouth 2 (two) times daily. (Patient not taking: Reported on 05/24/2018)  . enoxaparin (LOVENOX) 30 MG/0.3ML injection Inject 0.3 mLs (30 mg total) into the skin daily. (Patient not taking: Reported on 05/24/2018)  . feeding supplement, ENSURE ENLIVE, (ENSURE ENLIVE) LIQD Take 237 mLs  by mouth 2 (two) times daily between meals.  . ferrous sulfate 325 (65 FE) MG tablet Take 1 tablet (325 mg total) by mouth 3 (three) times daily with meals. (Patient not taking: Reported on 05/24/2018)  . guaiFENesin-codeine (ROBITUSSIN AC) 100-10 MG/5ML syrup Take 5 mLs by mouth 3 (three) times daily as needed for cough. Reported on 04/29/2015 (Patient not taking: Reported on 05/24/2018)  . methocarbamol (ROBAXIN) 500 MG tablet Take 1 tablet (500 mg total) by mouth every 6 (six) hours as needed for muscle spasms. (Patient not taking: Reported on 05/24/2018)  . mirtazapine (REMERON) 15 MG tablet Take 15 mg by mouth at bedtime.  . ondansetron (ZOFRAN ODT) 4 MG disintegrating tablet Take 1 tablet (4 mg total) by mouth every 8 (eight) hours as needed for nausea or vomiting. (Patient not taking: Reported on 10/19/2015)  . polyethylene glycol (MIRALAX / GLYCOLAX) packet Take 17 g by mouth daily as needed for mild constipation. (Patient not taking: Reported on 05/24/2018)  . traMADol (ULTRAM) 50 MG tablet Take 1 tablet (50 mg total) by mouth every 6 (six) hours as needed for moderate pain or severe pain. (Patient not taking: Reported on 07/20/2018)   No facility-administered encounter medications on file as of 05/09/2019.     Fronie Holstein Jenetta Downer, NP

## 2019-06-11 ENCOUNTER — Telehealth: Payer: Self-pay | Admitting: Adult Health Nurse Practitioner

## 2019-06-11 ENCOUNTER — Other Ambulatory Visit: Payer: Self-pay

## 2019-06-11 ENCOUNTER — Other Ambulatory Visit: Payer: Medicare Other | Admitting: Adult Health Nurse Practitioner

## 2019-06-11 DIAGNOSIS — F0280 Dementia in other diseases classified elsewhere without behavioral disturbance: Secondary | ICD-10-CM

## 2019-06-11 DIAGNOSIS — G309 Alzheimer's disease, unspecified: Secondary | ICD-10-CM

## 2019-06-11 DIAGNOSIS — Z515 Encounter for palliative care: Secondary | ICD-10-CM

## 2019-06-11 NOTE — Telephone Encounter (Signed)
Son calling about refill for santyl ointment.  Set up appointment for tomorrow at 11:30 am to check wound to see if that was still needed. Raynetta Osterloh K. Garner Nash NP

## 2019-06-11 NOTE — Progress Notes (Signed)
Therapist, nutritional Palliative Care Consult Note Telephone: [phone]  Fax: [phone]  PATIENT NAME: Caitlin Ross DOB: 1922-04-27 MRN: [id number]  PRIMARY CARE PROVIDER:  Dr. Philis Pique  REFERRING PROVIDER:  Lyndon Code, MD [address] [place],  [place] [address]  RESPONSIBLE PARTY:   Tempie Donning [phone]    RECOMMENDATIONS and PLAN:  1.  Advanced care planning. Patient is a DNR  2.  Dementia.  FAST 7c.  Patient is bed and wheelchair bound.  She requires total care.  She requires to be propped up on both sides when sitting in wheelchair.  She is nonverbal.  She is incontinent of B&B.  She needs to be fed and caregiver will spend an hour or more feeding her one meal.  No reported weight loss.  3.  Wounds.  Patient continues to have wound to midback that still has slough in wound bed with some greenish coloration in center. Has nonblanchable purplish reddened area around wound.  This is currently being treated with santyl ointment. She has new wound to left buttock that is unstageable.  Patient has air mattress and caregivers who turn her frequently and take care of wounds.  She is currently being followed by home health RN for wound care.  Presented case to hospice physicians who believe that with the unhealing wounds is hospice eligible.  Son is in agreement with hospice services. Have reached out to Dr. Merlinda Frederick for hospice referral.    I spent 60 minutes providing this consultation,  from 11:30 to 12:30 including time spent with patient/family, chart review, provider coordination, documentation. More than 50% of the time in this consultation was spent coordinating communication.   HISTORY OF PRESENT ILLNESS:  Caitlin Ross is a 86 y.o. year old female with multiple medical problems including Alzheimer's dementia,dysphagia,anemia,hypertension,herpes zoster, allergies,right hip arthroplasty,history ofrecurrent UTI's.  Palliative Care was asked to help address goals of care.   CODE STATUS: DNR  PPS: 30% HOSPICE ELIGIBILITY/DIAGNOSIS: yes/dementia, PCM, unhealing wounds  PHYSICAL EXAM:   General: NAD, frail appearing, thin Extremities: no edema,contracture of right knee Skin:on mid back has unstagable pressure injury; slough noted in center with purplish red area around it thatisunblanchable. The wound is approximately 5 cm by 3 cm. New wound to left buttock that is approximately 3cm by 3cm that has noted slough and pink area around wound that is blanchable Neurological: Weakness; nonverbal  PAST MEDICAL HISTORY:  Past Medical History:  Diagnosis Date  . Alzheimer's disease   . Asthma   . Atrophic vaginitis   . Dementia   . Hypertension   . Urinary tract infection     SOCIAL HX:  Social History   Tobacco Use  . Smoking status: Never Smoker  . Smokeless tobacco: Never Used  Substance Use Topics  . Alcohol use: No    Alcohol/week: 0.0 standard drinks    ALLERGIES: No Known Allergies   PERTINENT MEDICATIONS:  Outpatient Encounter Medications as of 06/11/2019  Medication Sig  . acetaminophen (TYLENOL) 325 MG tablet Take 650 mg by mouth every 4 (four) hours as needed.  Marland Kitchen alum & mag hydroxide-simeth (MAALOX/MYLANTA) 200-200-20 MG/5ML suspension Take 30 mLs by mouth every 4 (four) hours as needed for indigestion. (Patient not taking: Reported on 05/24/2018)  . Ascorbic Acid (C-500) 500 MG CHEW Chew by mouth.  Marland Kitchen aspirin 81 MG chewable tablet Chew 81 mg by mouth daily.  . bisacodyl (DULCOLAX) 10 MG suppository Place 1 suppository (10 mg total) rectally daily  as needed for moderate constipation. (Patient not taking: Reported on 05/24/2018)  . Cranberry 500 MG CAPS Take by mouth.  . docusate sodium (COLACE) 100 MG capsule Take 1 capsule (100 mg total) by mouth 2 (two) times daily. (Patient not taking: Reported on 05/24/2018)  . enoxaparin (LOVENOX) 30 MG/0.3ML injection Inject 0.3 mLs (30 mg  total) into the skin daily. (Patient not taking: Reported on 05/24/2018)  . feeding supplement, ENSURE ENLIVE, (ENSURE ENLIVE) LIQD Take 237 mLs by mouth 2 (two) times daily between meals.  . ferrous sulfate 325 (65 FE) MG tablet Take 1 tablet (325 mg total) by mouth 3 (three) times daily with meals. (Patient not taking: Reported on 05/24/2018)  . guaiFENesin-codeine (ROBITUSSIN AC) 100-10 MG/5ML syrup Take 5 mLs by mouth 3 (three) times daily as needed for cough. Reported on 04/29/2015 (Patient not taking: Reported on 05/24/2018)  . methocarbamol (ROBAXIN) 500 MG tablet Take 1 tablet (500 mg total) by mouth every 6 (six) hours as needed for muscle spasms. (Patient not taking: Reported on 05/24/2018)  . mirtazapine (REMERON) 15 MG tablet Take 15 mg by mouth at bedtime.  . ondansetron (ZOFRAN ODT) 4 MG disintegrating tablet Take 1 tablet (4 mg total) by mouth every 8 (eight) hours as needed for nausea or vomiting. (Patient not taking: Reported on 10/19/2015)  . polyethylene glycol (MIRALAX / GLYCOLAX) packet Take 17 g by mouth daily as needed for mild constipation. (Patient not taking: Reported on 05/24/2018)  . traMADol (ULTRAM) 50 MG tablet Take 1 tablet (50 mg total) by mouth every 6 (six) hours as needed for moderate pain or severe pain. (Patient not taking: Reported on 07/20/2018)   No facility-administered encounter medications on file as of 06/11/2019.      Kendall Arnell Jenetta Downer, NP

## 2020-02-25 DEATH — deceased
